# Patient Record
Sex: Female | Born: 1955 | Race: White | Hispanic: No | Marital: Married | Smoking: Former smoker
Health system: Southern US, Community
[De-identification: ages and names within clinical notes are randomized; demographics above are authoritative.]

## PROBLEM LIST (undated history)

## (undated) DIAGNOSIS — Z973 Presence of spectacles and contact lenses: Secondary | ICD-10-CM

## (undated) DIAGNOSIS — N200 Calculus of kidney: Secondary | ICD-10-CM

## (undated) DIAGNOSIS — K5730 Diverticulosis of large intestine without perforation or abscess without bleeding: Secondary | ICD-10-CM

## (undated) DIAGNOSIS — K219 Gastro-esophageal reflux disease without esophagitis: Secondary | ICD-10-CM

## (undated) DIAGNOSIS — N201 Calculus of ureter: Secondary | ICD-10-CM

## (undated) HISTORY — PX: UMBILICAL HERNIA REPAIR: SHX196

---

## 2010-04-26 ENCOUNTER — Encounter (INDEPENDENT_AMBULATORY_CARE_PROVIDER_SITE_OTHER): Payer: Self-pay | Admitting: Obstetrics and Gynecology

## 2010-04-26 ENCOUNTER — Ambulatory Visit (HOSPITAL_COMMUNITY): Admission: RE | Admit: 2010-04-26 | Discharge: 2010-04-27 | Payer: Self-pay | Admitting: Obstetrics and Gynecology

## 2010-04-26 HISTORY — PX: LAPAROSCOPIC ASSISTED VAGINAL HYSTERECTOMY: SHX5398

## 2010-08-29 LAB — COMPREHENSIVE METABOLIC PANEL WITH GFR
ALT: 12 U/L (ref 0–35)
AST: 22 U/L (ref 0–37)
Albumin: 2.9 g/dL — ABNORMAL LOW (ref 3.5–5.2)
Alkaline Phosphatase: 44 U/L (ref 39–117)
BUN: 3 mg/dL — ABNORMAL LOW (ref 6–23)
CO2: 29 meq/L (ref 19–32)
Calcium: 8.3 mg/dL — ABNORMAL LOW (ref 8.4–10.5)
Chloride: 102 meq/L (ref 96–112)
Creatinine, Ser: 0.63 mg/dL (ref 0.4–1.2)
GFR calc non Af Amer: >60 mL/min
Glucose, Bld: 119 mg/dL — ABNORMAL HIGH (ref 70–99)
Potassium: 4.5 meq/L (ref 3.5–5.1)
Sodium: 136 meq/L (ref 135–145)
Total Bilirubin: 0.8 mg/dL (ref 0.3–1.2)
Total Protein: 5.6 g/dL — ABNORMAL LOW (ref 6.0–8.3)

## 2010-08-29 LAB — CBC
HCT: 39.9 % (ref 36.0–46.0)
Hemoglobin: 10.5 g/dL — ABNORMAL LOW (ref 12.0–15.0)
Hemoglobin: 13.4 g/dL (ref 12.0–15.0)
MCH: 28.9 pg (ref 26.0–34.0)
MCH: 29.8 pg (ref 26.0–34.0)
MCHC: 33.5 g/dL (ref 30.0–36.0)
MCHC: 34.2 g/dL (ref 30.0–36.0)
MCV: 86.1 fL (ref 78.0–100.0)
Platelets: 255 10*3/uL (ref 150–400)
RBC: 4.64 MIL/uL (ref 3.87–5.11)
RDW: 16.4 % — ABNORMAL HIGH (ref 11.5–15.5)
RDW: 16.5 % — ABNORMAL HIGH (ref 11.5–15.5)
WBC: 6 10*3/uL (ref 4.0–10.5)

## 2010-08-29 LAB — SURGICAL PCR SCREEN
MRSA, PCR: NEGATIVE
Staphylococcus aureus: NEGATIVE

## 2011-04-04 ENCOUNTER — Other Ambulatory Visit: Payer: Self-pay | Admitting: Obstetrics and Gynecology

## 2012-04-23 ENCOUNTER — Other Ambulatory Visit: Payer: Self-pay | Admitting: Obstetrics and Gynecology

## 2012-04-23 DIAGNOSIS — N644 Mastodynia: Secondary | ICD-10-CM

## 2012-04-30 ENCOUNTER — Ambulatory Visit
Admission: RE | Admit: 2012-04-30 | Discharge: 2012-04-30 | Disposition: A | Discharge status: Home / Self Care | Payer: PRIVATE HEALTH INSURANCE | Source: Ambulatory Visit | Attending: Obstetrics and Gynecology | Admitting: Obstetrics and Gynecology

## 2012-04-30 DIAGNOSIS — N644 Mastodynia: Secondary | ICD-10-CM

## 2013-07-27 ENCOUNTER — Encounter (HOSPITAL_COMMUNITY): Payer: Self-pay | Admitting: Emergency Medicine

## 2013-07-27 ENCOUNTER — Emergency Department (HOSPITAL_COMMUNITY): Payer: PRIVATE HEALTH INSURANCE

## 2013-07-27 ENCOUNTER — Emergency Department (HOSPITAL_COMMUNITY)
Admission: EM | Admit: 2013-07-27 | Discharge: 2013-07-27 | Disposition: A | Discharge status: Home / Self Care | Payer: PRIVATE HEALTH INSURANCE | Attending: Emergency Medicine | Admitting: Emergency Medicine

## 2013-07-27 DIAGNOSIS — Z87891 Personal history of nicotine dependence: Secondary | ICD-10-CM | POA: Insufficient documentation

## 2013-07-27 DIAGNOSIS — N200 Calculus of kidney: Secondary | ICD-10-CM

## 2013-07-27 LAB — URINE MICROSCOPIC-ADD ON

## 2013-07-27 LAB — URINALYSIS, ROUTINE W REFLEX MICROSCOPIC
Bilirubin Urine: NEGATIVE
Glucose, UA: NEGATIVE mg/dL
KETONES UR: NEGATIVE mg/dL
Nitrite: NEGATIVE
PROTEIN: 30 mg/dL — AB
Specific Gravity, Urine: 1.022 (ref 1.005–1.030)
UROBILINOGEN UA: 0.2 mg/dL (ref 0.0–1.0)
pH: 5.5 (ref 5.0–8.0)

## 2013-07-27 LAB — CBC WITH DIFFERENTIAL/PLATELET
Basophils Absolute: 0 10*3/uL (ref 0.0–0.1)
Basophils Relative: 0 % (ref 0–1)
Eosinophils Absolute: 0.1 10*3/uL (ref 0.0–0.7)
Eosinophils Relative: 1 % (ref 0–5)
HEMATOCRIT: 41.9 % (ref 36.0–46.0)
HEMOGLOBIN: 15 g/dL (ref 12.0–15.0)
LYMPHS PCT: 18 % (ref 12–46)
Lymphs Abs: 1.9 10*3/uL (ref 0.7–4.0)
MCH: 33 pg (ref 26.0–34.0)
MCHC: 35.8 g/dL (ref 30.0–36.0)
MCV: 92.3 fL (ref 78.0–100.0)
MONO ABS: 0.2 10*3/uL (ref 0.1–1.0)
MONOS PCT: 2 % — AB (ref 3–12)
NEUTROS ABS: 8.2 10*3/uL — AB (ref 1.7–7.7)
NEUTROS PCT: 79 % — AB (ref 43–77)
Platelets: 248 10*3/uL (ref 150–400)
RBC: 4.54 MIL/uL (ref 3.87–5.11)
RDW: 12.5 % (ref 11.5–15.5)
WBC: 10.3 10*3/uL (ref 4.0–10.5)

## 2013-07-27 LAB — COMPREHENSIVE METABOLIC PANEL
ALT: 20 U/L (ref 0–35)
AST: 19 U/L (ref 0–37)
Albumin: 4 g/dL (ref 3.5–5.2)
Alkaline Phosphatase: 59 U/L (ref 39–117)
BUN: 13 mg/dL (ref 6–23)
CALCIUM: 9.5 mg/dL (ref 8.4–10.5)
CO2: 26 mEq/L (ref 19–32)
CREATININE: 0.69 mg/dL (ref 0.50–1.10)
Chloride: 99 mEq/L (ref 96–112)
GFR calc non Af Amer: >90 mL/min (ref >90)
GLUCOSE: 134 mg/dL — AB (ref 70–99)
Potassium: 4.3 mEq/L (ref 3.7–5.3)
SODIUM: 139 meq/L (ref 137–147)
TOTAL PROTEIN: 7.7 g/dL (ref 6.0–8.3)
Total Bilirubin: 0.5 mg/dL (ref 0.3–1.2)

## 2013-07-27 MED ORDER — ONDANSETRON 4 MG PO TBDP: 4.0000 mg | ORAL_TABLET | Freq: Three times a day (TID) | ORAL | Status: DC | PRN

## 2013-07-27 MED ORDER — HYDROMORPHONE HCL PF 1 MG/ML IJ SOLN
0.5000 mg | Freq: Once | INTRAMUSCULAR | Status: AC
  Administered 2013-07-27: 0.5 mg via INTRAVENOUS
  Filled 2013-07-27: qty 1

## 2013-07-27 MED ORDER — SODIUM CHLORIDE 0.9 % IV BOLUS (SEPSIS)
1000.0000 mL | Freq: Once | INTRAVENOUS | Status: AC
  Administered 2013-07-27: 1000 mL via INTRAVENOUS

## 2013-07-27 MED ORDER — ONDANSETRON HCL 4 MG/2ML IJ SOLN
4.0000 mg | Freq: Once | INTRAMUSCULAR | Status: AC
  Administered 2013-07-27: 4 mg via INTRAVENOUS
  Filled 2013-07-27: qty 2

## 2013-07-27 MED ORDER — HYDROCODONE-ACETAMINOPHEN 5-325 MG PO TABS: 1.0000 | ORAL_TABLET | Freq: Four times a day (QID) | ORAL | Status: DC | PRN

## 2013-07-27 NOTE — ED Provider Notes (Signed)
CSN: [id number]     Arrival date & time 07/27/13  [id number] History   First MD Initiated Contact with Patient 07/27/13 [phone]     Chief Complaint  Patient presents with  . Pelvic Pain    The history is provided by the patient and the spouse.    Patient presents with right lower quadrant pain.  Pain woke the patient up from sleep approximately 3 hours prior to my evaluation.  Since onset has been constant, though with waxing/waning severity.  Pain is sharp, burning, focally about the right lower quadrant with minimal radiation. Associated nausea, anorexia, no vomiting, no diarrhea, no urinary changes. No fever, no chills, though there is generalized discomfort. No clear alleviating or exacerbating factors. Patient has history of hysterectomy, cystopexy, abdominal hernia repair.   History reviewed. No pertinent past medical history. History reviewed. No pertinent past surgical history. History reviewed. No pertinent family history. History  Substance Use Topics  . Smoking status: Former Games developermoker  . Smokeless tobacco: Not on file  . Alcohol Use: Yes   OB History   Grav Para Term Preterm Abortions TAB SAB Ect Mult Living                 Review of Systems  Constitutional:       Per HPI, otherwise negative  HENT:       Per HPI, otherwise negative  Respiratory:       Per HPI, otherwise negative  Cardiovascular:       Per HPI, otherwise negative  Gastrointestinal: Positive for nausea and abdominal pain. Negative for vomiting.  Endocrine:       Negative aside from HPI  Genitourinary:       Neg aside from HPI   Musculoskeletal:       Per HPI, otherwise negative  Skin: Negative.   Neurological: Negative for syncope.    Allergies  Review of patient's allergies indicates not on file.  Home Medications  No current outpatient prescriptions on file. BP 146/54  Pulse 58  Temp(Src) 97.6 F (36.4 C) (Oral)  Resp 20  SpO2 100% Physical Exam  Nursing note and vitals  reviewed. Constitutional: She is oriented to person, place, and time. She appears well-developed and well-nourished. No distress.  HENT:  Head: Normocephalic and atraumatic.  Eyes: Conjunctivae and EOM are normal.  Cardiovascular: Normal rate and regular rhythm.   Pulmonary/Chest: Effort normal and breath sounds normal. No stridor. No respiratory distress.  Abdominal: She exhibits no distension. There is tenderness in the right lower quadrant. There is tenderness at McBurney's point. There is no rigidity, no rebound, no guarding and negative Murphy's sign.    Musculoskeletal: She exhibits no edema.  Neurological: She is alert and oriented to person, place, and time. No cranial nerve deficit.  Skin: Skin is warm and dry.  Psychiatric: She has a normal mood and affect.    ED Course  Procedures (including critical care time) Labs Review Labs Reviewed  URINALYSIS, ROUTINE W REFLEX MICROSCOPIC  COMPREHENSIVE METABOLIC PANEL  CBC WITH DIFFERENTIAL   Imaging Review No results found.  EKG Interpretation   None      8:58 AM Patient with substantially better pain level.   MDM  This patient presents with new right lower quadrant  pain.  On exam she is awake, alert, hemodynamically stable, afebrile. Patient's pain resolved here.  Evaluation demonstrates presence of a nonobstructive  kidney stone, with no evidence of infection. Patient was discharged with analgesics, antiemetics, urology followup.  Gerhard Munch, MD 07/27/13 [phone]

## 2013-07-27 NOTE — ED Notes (Signed)
Pelvic cart at bedside, pt placed in gown. MD at bedside

## 2013-07-27 NOTE — ED Notes (Signed)
Pt arrived to the Ed with a complaint of right lower pelvic pain.  Pt states the pain woke her out of her sleep at 0400.  Pt states she is able to urinate without difficulty.  Pt states the pain is sharp in nature and intermittently grows intolerable,

## 2013-07-27 NOTE — Discharge Instructions (Signed)
Kidney Stones  Kidney stones (urolithiasis) are deposits that form inside your kidneys. The intense pain is caused by the stone moving through the urinary tract. When the stone moves, the ureter goes into spasm around the stone. The stone is usually passed in the urine.   CAUSES   · A disorder that makes certain neck glands produce too much parathyroid hormone (primary hyperparathyroidism).  · A buildup of uric acid crystals, similar to gout in your joints.  · Narrowing (stricture) of the ureter.  · A kidney obstruction present at birth (congenital obstruction).  · Previous surgery on the kidney or ureters.  · Numerous kidney infections.  SYMPTOMS   · Feeling sick to your stomach (nauseous).  · Throwing up (vomiting).  · Blood in the urine (hematuria).  · Pain that usually spreads (radiates) to the groin.  · Frequency or urgency of urination.  DIAGNOSIS   · Taking a history and physical exam.  · Blood or urine tests.  · CT scan.  · Occasionally, an examination of the inside of the urinary bladder (cystoscopy) is performed.  TREATMENT   · Observation.  · Increasing your fluid intake.  · Extracorporeal shock wave lithotripsy This is a noninvasive procedure that uses shock waves to break up kidney stones.  · Surgery may be needed if you have severe pain or persistent obstruction. There are various surgical procedures. Most of the procedures are performed with the use of small instruments. Only small incisions are needed to accommodate these instruments, so recovery time is minimized.  The size, location, and chemical composition are all important variables that will determine the proper choice of action for you. Talk to your health care provider to better understand your situation so that you will minimize the risk of injury to yourself and your kidney.   HOME CARE INSTRUCTIONS   · Drink enough water and fluids to keep your urine clear or pale yellow. This will help you to pass the stone or stone fragments.  · Strain  all urine through the provided strainer. Keep all particulate matter and stones for your health care provider to see. The stone causing the pain may be as small as a grain of salt. It is very important to use the strainer each and every time you pass your urine. The collection of your stone will allow your health care provider to analyze it and verify that a stone has actually passed. The stone analysis will often identify what you can do to reduce the incidence of recurrences.  · Only take over-the-counter or prescription medicines for pain, discomfort, or fever as directed by your health care provider.  · Make a follow-up appointment with your health care provider as directed.  · Get follow-up X-rays if required. The absence of pain does not always mean that the stone has passed. It may have only stopped moving. If the urine remains completely obstructed, it can cause loss of kidney function or even complete destruction of the kidney. It is your responsibility to make sure X-rays and follow-ups are completed. Ultrasounds of the kidney can show blockages and the status of the kidney. Ultrasounds are not associated with any radiation and can be performed easily in a matter of minutes.  SEEK MEDICAL CARE IF:  · You experience pain that is progressive and unresponsive to any pain medicine you have been prescribed.  SEEK IMMEDIATE MEDICAL CARE IF:   · Pain cannot be controlled with the prescribed medicine.  · You have a fever   or shaking chills.  · The severity or intensity of pain increases over 18 hours and is not relieved by pain medicine.  · You develop a new onset of abdominal pain.  · You feel faint or pass out.  · You are unable to urinate.  MAKE SURE YOU:   · Understand these instructions.  · Will watch your condition.  · Will get help right away if you are not doing well or get worse.  Document Released: 06/04/2005 Document Revised: 02/04/2013 Document Reviewed: 11/05/2012  [employer]® Patient Information ©2014  [employer].

## 2013-10-01 ENCOUNTER — Other Ambulatory Visit: Payer: Self-pay | Admitting: Obstetrics and Gynecology

## 2013-10-21 ENCOUNTER — Emergency Department (HOSPITAL_COMMUNITY)
Admission: EM | Admit: 2013-10-21 | Discharge: 2013-10-22 | Disposition: A | Discharge status: Home / Self Care | Payer: PRIVATE HEALTH INSURANCE | Attending: Emergency Medicine | Admitting: Emergency Medicine

## 2013-10-21 ENCOUNTER — Encounter (HOSPITAL_COMMUNITY): Payer: Self-pay | Admitting: Emergency Medicine

## 2013-10-21 ENCOUNTER — Emergency Department (HOSPITAL_COMMUNITY): Payer: PRIVATE HEALTH INSURANCE

## 2013-10-21 DIAGNOSIS — Z87891 Personal history of nicotine dependence: Secondary | ICD-10-CM | POA: Insufficient documentation

## 2013-10-21 DIAGNOSIS — R112 Nausea with vomiting, unspecified: Secondary | ICD-10-CM | POA: Diagnosis not present

## 2013-10-21 DIAGNOSIS — R109 Unspecified abdominal pain: Secondary | ICD-10-CM | POA: Diagnosis present

## 2013-10-21 DIAGNOSIS — N201 Calculus of ureter: Secondary | ICD-10-CM | POA: Insufficient documentation

## 2013-10-21 LAB — URINALYSIS, DIPSTICK ONLY
GLUCOSE, UA: NEGATIVE mg/dL
Ketones, ur: 15 mg/dL — AB
NITRITE: NEGATIVE
PROTEIN: 100 mg/dL — AB
Specific Gravity, Urine: 1.025 (ref 1.005–1.030)
Urobilinogen, UA: 0.2 mg/dL (ref 0.0–1.0)
pH: 5.5 (ref 5.0–8.0)

## 2013-10-21 LAB — CBC
HCT: 42 % (ref 36.0–46.0)
Hemoglobin: 14.8 g/dL (ref 12.0–15.0)
MCH: 32.7 pg (ref 26.0–34.0)
MCHC: 35.2 g/dL (ref 30.0–36.0)
MCV: 92.7 fL (ref 78.0–100.0)
PLATELETS: 246 10*3/uL (ref 150–400)
RBC: 4.53 MIL/uL (ref 3.87–5.11)
RDW: 12.5 % (ref 11.5–15.5)
WBC: 12.8 10*3/uL — AB (ref 4.0–10.5)

## 2013-10-21 LAB — COMPREHENSIVE METABOLIC PANEL
ALBUMIN: 4 g/dL (ref 3.5–5.2)
ALT: 20 U/L (ref 0–35)
AST: 28 U/L (ref 0–37)
Alkaline Phosphatase: 59 U/L (ref 39–117)
BUN: 12 mg/dL (ref 6–23)
CALCIUM: 9.2 mg/dL (ref 8.4–10.5)
CHLORIDE: 102 meq/L (ref 96–112)
CO2: 25 meq/L (ref 19–32)
CREATININE: 0.71 mg/dL (ref 0.50–1.10)
GFR calc Af Amer: >90 mL/min (ref >90)
Glucose, Bld: 106 mg/dL — ABNORMAL HIGH (ref 70–99)
Potassium: 4.2 mEq/L (ref 3.7–5.3)
SODIUM: 141 meq/L (ref 137–147)
Total Bilirubin: 0.6 mg/dL (ref 0.3–1.2)
Total Protein: 7.6 g/dL (ref 6.0–8.3)

## 2013-10-21 MED ORDER — ONDANSETRON HCL 4 MG/2ML IJ SOLN
4.0000 mg | Freq: Once | INTRAMUSCULAR | Status: AC
  Administered 2013-10-21: 4 mg via INTRAVENOUS
  Filled 2013-10-21: qty 2

## 2013-10-21 MED ORDER — FENTANYL CITRATE 0.05 MG/ML IJ SOLN
50.0000 ug | Freq: Once | INTRAMUSCULAR | Status: AC
  Administered 2013-10-21: 50 ug via INTRAVENOUS
  Filled 2013-10-21: qty 2

## 2013-10-21 NOTE — ED Provider Notes (Signed)
CSN: [id number]     Arrival date & time 10/21/13  2119 History   First MD Initiated Contact with Patient 10/21/13 2220     Chief Complaint  Patient presents with  . Flank Pain     (Consider location/radiation/quality/duration/timing/severity/associated sxs/prior Treatment) HPI Comments: Patient is a 58 year old female past medical history significant for renal disorder presented to the emergency department for right flank pain with radiation into the abdomen. Patient states her symptoms initially started last Friday and were very mild. She states her symptoms seem to have resolved until yesterday when they returned more severely. She states that she is having sharp colicky pain with radiation into right lower quadrant with associated nausea and emesis. Patient states that she has also been getting over a stomach bug, she has had multiple days prior with nausea, nonbloody nonbilious emesis was nonbloody diarrhea, she states her husband was also sick with the same sometimes. Patient states that this pain feels similar to her in February when she had a kidney stone. Patient has a scheduled followup with [facility] tomorrow. Denies any fevers.   Past Medical History  Diagnosis Date  . Renal disorder    Past Surgical History  Procedure Laterality Date  . Abdominal hysterectomy    . Myomectomy     No family history on file. History  Substance Use Topics  . Smoking status: Former Games developer  . Smokeless tobacco: Not on file  . Alcohol Use: Yes     Comment: rare   OB History   Grav Para Term Preterm Abortions TAB SAB Ect Mult Living                 Review of Systems  Constitutional: Negative for fever and chills.  Gastrointestinal: Positive for nausea, vomiting and abdominal pain.  Genitourinary: Positive for urgency, flank pain and decreased urine volume.  All other systems reviewed and are negative.     Allergies  Iodine and Avocado  Home Medications   Prior to Admission  medications   Medication Sig Start Date End Date Taking? Authorizing Provider  calcium carbonate (TUMS EX) 750 MG chewable tablet Chew 1 tablet by mouth 2 (two) times daily as needed for heartburn. Acid reflex   Yes Historical Provider, MD  diphenhydramine-acetaminophen (TYLENOL PM) 25-500 MG TABS Take 1 tablet by mouth at bedtime as needed (sleep).   Yes Historical Provider, MD  estrogens, conjugated, (PREMARIN) 0.625 MG tablet Take 0.625 mg by mouth at bedtime.   Yes Historical Provider, MD  fluticasone (FLONASE) 50 MCG/ACT nasal spray Place 1 spray into both nostrils daily as needed for allergies or rhinitis.   Yes Historical Provider, MD  HYDROcodone-acetaminophen (NORCO/VICODIN) 5-325 MG per tablet Take 1 tablet by mouth every 6 (six) hours as needed for moderate pain.   Yes Historical Provider, MD  ibuprofen (ADVIL,MOTRIN) 200 MG tablet Take 400 mg by mouth every 6 (six) hours as needed for moderate pain.   Yes Historical Provider, MD   BP 135/67  Pulse 68  Temp(Src) 98.4 F (36.9 C) (Oral)  Resp 18  Ht 5\' 3"  (1.6 m)  Wt 180 lb (81.647 kg)  BMI 31.89 kg/m2  SpO2 99% Physical Exam  Nursing note and vitals reviewed. Constitutional: She is oriented to person, place, and time. She appears well-developed and well-nourished. No distress.  HENT:  Head: Normocephalic and atraumatic.  Right Ear: External ear normal.  Left Ear: External ear normal.  Nose: Nose normal.  Mouth/Throat: Uvula is midline, oropharynx is clear and  moist and mucous membranes are normal.  Eyes: Conjunctivae are normal.  Neck: Normal range of motion. Neck supple.  Cardiovascular: Normal rate, regular rhythm and normal heart sounds.   Pulmonary/Chest: Effort normal and breath sounds normal. No respiratory distress.  Abdominal: Soft. Bowel sounds are normal. She exhibits no distension. There is tenderness. There is no rebound.  Musculoskeletal: Normal range of motion.  Neurological: She is alert and oriented to  person, place, and time.  Skin: Skin is warm and dry. She is not diaphoretic.  Psychiatric: She has a normal mood and affect.    ED Course  Procedures (including critical care time) Medications  ondansetron (ZOFRAN) injection 4 mg (4 mg Intravenous Given 10/21/13 2227)  fentaNYL (SUBLIMAZE) injection 50 mcg (50 mcg Intravenous Given 10/21/13 2227)  ondansetron (ZOFRAN-ODT) disintegrating tablet 8 mg (8 mg Oral Given 10/22/13 0107)  oxyCODONE-acetaminophen (PERCOCET/ROXICET) 5-325 MG per tablet 1 tablet (1 tablet Oral Given 10/22/13 0107)    Labs Review Labs Reviewed  CBC - Abnormal; Notable for the following:    WBC 12.8 (*)    All other components within normal limits  COMPREHENSIVE METABOLIC PANEL - Abnormal; Notable for the following:    Glucose, Bld 106 (*)    All other components within normal limits  URINALYSIS, DIPSTICK ONLY - Abnormal; Notable for the following:    Hgb urine dipstick LARGE (*)    Bilirubin Urine SMALL (*)    Ketones, ur 15 (*)    Protein, ur 100 (*)    Leukocytes, UA TRACE (*)    All other components within normal limits  URINE CULTURE    Imaging Review Ct Abdomen Pelvis Wo Contrast  10/22/2013   ADDENDUM REPORT: 10/22/2013 00:40  ADDENDUM: There is an error within the impression portion of the report. The first line of the impression should read 4 mm obstructive stone within the distal right ureter with secondary moderate right hydroureteronephrosis. The word "or" in the initial report is a dictation error.   Electronically Signed   By: Rise MuBenjamin  McClintock M.D.   On: 10/22/2013 00:40   10/22/2013   CLINICAL DATA:  Right flank pain.  EXAM: CT ABDOMEN AND PELVIS WITHOUT CONTRAST  TECHNIQUE: Multidetector CT imaging of the abdomen and pelvis was performed following the standard protocol without IV contrast.  COMPARISON:  Prior CT from 07/27/2013  FINDINGS: The visualized lung bases are clear.  Diffuse hypoattenuation of the liver is compatible with steatosis.  Gallbladder is within normal limits. No biliary ductal dilatation. The spleen, adrenal glands, and pancreas demonstrate a normal unenhanced appearance. There is an AA and 4 mm obstructive stone within the distal right ureter (series 2, image 72), approximately 1 cm proximal to the right UVJ. There is secondary moderate right hydroureteronephrosis. There is associated perinephric and periureteral fat stranding.  3 mm nonobstructive stone present within the lower pole of the left kidney. A few additional punctate calcific density seen within the interpolar left kidney may represent additional small stones. No stones seen along the course of the left renal collecting system. There is no left-sided hydronephrosis or hydroureter.  No evidence of bowel obstruction. No inflammatory changes seen about the bowels. Appendix is normal. Sigmoid diverticulosis without acute diverticulitis.  Bladder is within normal limits.  Uterus and ovaries are not visualized.  No free air or fluid. No enlarged intra-abdominal pelvic lymph nodes.  No acute osseous abnormality. No worrisome lytic or blastic osseous lesions.  IMPRESSION: 1. 4 mm or obstructive stone within the distal right  ureter with secondary moderate right hydroureteronephrosis. 2. 3 mm nonobstructive left renal calculus. 3. Sigmoid diverticulosis without acute diverticulitis. 4. Hepatic steatosis.  Electronically Signed: By: Rise MuBenjamin  McClintock M.D. On: 10/22/2013 00:19     EKG Interpretation None      MDM   Final diagnoses:  Right ureteral stone    Filed Vitals:   10/22/13 0111  BP: 135/67  Pulse: 68  Temp: 98.4 F (36.9 C)  Resp: 18   Afebrile, NAD, non-toxic appearing, AAOx4. Abdomen is soft, mildly tender in right lower quadrant, normal bowel sounds. No peritoneal signs. No CVA tenderness. Labs reviewed.  Pt has been diagnosed with a Kidney Stone via CT. There is no evidence of significant hydronephrosis, serum creatine WNL, vitals sign stable  and the pt does not have irratractable vomiting. Patient able to tolerate by mouth liquids in the emergency department without difficulty. Pt will be dc home with pain medications & has been advised to keep followup appointment with urologist tomorrow. Return precautions discussed with patient who is agreeable to plan and will return if anything changes prior to appointment. Patient stable at time of discharge.   Lise AuerJennifer L Flynn Lininger, PA-C 10/22/13 0121

## 2013-10-21 NOTE — ED Notes (Signed)
Pt c/o R flank pain onset yesterday with RLQ pain with n/v onset yesterday. Previous kidney stones, unable to keep pain meds down d/t vomiting.

## 2013-10-22 LAB — URINE CULTURE
COLONY COUNT: NO GROWTH
CULTURE: NO GROWTH

## 2013-10-22 MED ORDER — ONDANSETRON 8 MG PO TBDP
8.0000 mg | ORAL_TABLET | Freq: Once | ORAL | Status: AC
  Administered 2013-10-22: 8 mg via ORAL
  Filled 2013-10-22: qty 1

## 2013-10-22 MED ORDER — TAMSULOSIN HCL 0.4 MG PO CAPS: 0.4000 mg | ORAL_CAPSULE | Freq: Every day | ORAL | Status: DC

## 2013-10-22 MED ORDER — OXYCODONE-ACETAMINOPHEN 5-325 MG PO TABS
1.0000 | ORAL_TABLET | Freq: Four times a day (QID) | ORAL | Status: DC | PRN
Start: 2013-10-22 — End: 2013-11-11

## 2013-10-22 MED ORDER — ONDANSETRON 4 MG PO TBDP: 4.0000 mg | ORAL_TABLET | Freq: Three times a day (TID) | ORAL | Status: DC | PRN

## 2013-10-22 MED ORDER — OXYCODONE-ACETAMINOPHEN 5-325 MG PO TABS
1.0000 | ORAL_TABLET | Freq: Once | ORAL | Status: AC
  Administered 2013-10-22: 1 via ORAL
  Filled 2013-10-22: qty 1

## 2013-10-22 NOTE — Discharge Instructions (Signed)
Please follow up with your primary care physician in 1-2 days. If you do not have one please call the [facility] number listed above. Please follow up with your scheduled urology appointment tomorrow. Please take pain medication and/or muscle relaxants as prescribed and as needed for pain. Please do not drive on narcotic pain medication or on muscle relaxants. Please read all discharge instructions and return precautions.    Kidney Stones Kidney stones (urolithiasis) are deposits that form inside your kidneys. The intense pain is caused by the stone moving through the urinary tract. When the stone moves, the ureter goes into spasm around the stone. The stone is usually passed in the urine.  CAUSES   A disorder that makes certain neck glands produce too much parathyroid hormone (primary hyperparathyroidism).  A buildup of uric acid crystals, similar to gout in your joints.  Narrowing (stricture) of the ureter.  A kidney obstruction present at birth (congenital obstruction).  Previous surgery on the kidney or ureters.  Numerous kidney infections. SYMPTOMS   Feeling sick to your stomach (nauseous).  Throwing up (vomiting).  Blood in the urine (hematuria).  Pain that usually spreads (radiates) to the groin.  Frequency or urgency of urination. DIAGNOSIS   Taking a history and physical exam.  Blood or urine tests.  CT scan.  Occasionally, an examination of the inside of the urinary bladder (cystoscopy) is performed. TREATMENT   Observation.  Increasing your fluid intake.  Extracorporeal shock wave lithotripsy This is a noninvasive procedure that uses shock waves to break up kidney stones.  Surgery may be needed if you have severe pain or persistent obstruction. There are various surgical procedures. Most of the procedures are performed with the use of small instruments. Only small incisions are needed to accommodate these instruments, so recovery time is  minimized. The size, location, and chemical composition are all important variables that will determine the proper choice of action for you. Talk to your health care provider to better understand your situation so that you will minimize the risk of injury to yourself and your kidney.  HOME CARE INSTRUCTIONS   Drink enough water and fluids to keep your urine clear or pale yellow. This will help you to pass the stone or stone fragments.  Strain all urine through the provided strainer. Keep all particulate matter and stones for your health care provider to see. The stone causing the pain may be as small as a grain of salt. It is very important to use the strainer each and every time you pass your urine. The collection of your stone will allow your health care provider to analyze it and verify that a stone has actually passed. The stone analysis will often identify what you can do to reduce the incidence of recurrences.  Only take over-the-counter or prescription medicines for pain, discomfort, or fever as directed by your health care provider.  Make a follow-up appointment with your health care provider as directed.  Get follow-up X-rays if required. The absence of pain does not always mean that the stone has passed. It may have only stopped moving. If the urine remains completely obstructed, it can cause loss of kidney function or even complete destruction of the kidney. It is your responsibility to make sure X-rays and follow-ups are completed. Ultrasounds of the kidney can show blockages and the status of the kidney. Ultrasounds are not associated with any radiation and can be performed easily in a matter of minutes. SEEK MEDICAL CARE  IF:  You experience pain that is progressive and unresponsive to any pain medicine you have been prescribed. SEEK IMMEDIATE MEDICAL CARE IF:   Pain cannot be controlled with the prescribed medicine.  You have a fever or shaking chills.  The severity or intensity  of pain increases over 18 hours and is not relieved by pain medicine.  You develop a new onset of abdominal pain.  You feel faint or pass out.  You are unable to urinate. MAKE SURE YOU:   Understand these instructions.  Will watch your condition.  Will get help right away if you are not doing well or get worse. Document Released: 06/04/2005 Document Revised: 02/04/2013 Document Reviewed: 11/05/2012 [facility] Patient Information 2014 [place], [place].

## 2013-10-23 ENCOUNTER — Other Ambulatory Visit: Payer: Self-pay | Admitting: Urology

## 2013-10-23 NOTE — ED Provider Notes (Signed)
Medical screening examination/treatment/procedure(s) were performed by non-physician practitioner and as supervising physician I was immediately available for consultation/collaboration.   EKG Interpretation None        Lyanne CoKevin M Adalina Dopson, MD 10/23/13 0100

## 2013-11-04 ENCOUNTER — Encounter (HOSPITAL_BASED_OUTPATIENT_CLINIC_OR_DEPARTMENT_OTHER): Payer: Self-pay | Admitting: *Deleted

## 2013-11-05 ENCOUNTER — Encounter (HOSPITAL_BASED_OUTPATIENT_CLINIC_OR_DEPARTMENT_OTHER): Payer: Self-pay | Admitting: *Deleted

## 2013-11-05 NOTE — Progress Notes (Signed)
NPO AFTER MN. ARRIVE AT 1000. CURRENT LAB RESULTS IN CHART AND EPIC. MAY TAKE PAIN/ NAUSEA RX'S IF NEEDED AM DOS W/ SIPS OF WATER.

## 2013-11-10 NOTE — H&P (Signed)
Urology History and Physical Exam  CC: Right ureter stone.  HPI:  58 year old female presents today for a right ureter stone.  This was discovered on CT scan in the ER 10/21/13.  The stones 4 mm in size.  It was located in the right distal ureter.  She was seen previously in favor 2015 with a similar size and location stone which she does not think has passed.  This is associated with flank pain.  It is also associated with hydronephrosis.  She has a small left-sided 3 mm stone which is nonobstructing.  Urine culture in the ER from 5/6S 15 was negative for growth.  We have discussed management options along with risks, benefits, side effects, and likelihood of achieving goals.  Plan to proceed today with cystoscopy, right ureteroscopy, laser lithotripsy, right retrograde program, possible right ureter stent placement.  PMH: Past Medical History  Diagnosis Date  . Right ureteral stone   . Renal calculus, left   . Sigmoid diverticulosis   . GERD (gastroesophageal reflux disease)   . Wears contact lenses     PSH: Past Surgical History  Procedure Laterality Date  . Laparoscopic assisted vaginal hysterectomy  04-26-2010    W/  BILATERAL SALPINGOOPHORECTOMY/  TVT SLING/  ANTERIOR REPAIR  . Umbilical hernia repair  6 MONTHS OLD    Allergies: Allergies  Allergen Reactions  . Iodine Itching and Swelling    Eyes swell/  Mouth itches    . Shellfish Allergy Swelling  . Avocado Nausea And Vomiting, Swelling and Rash    Medications: No prescriptions prior to admission     Social History: History   Social History  . Marital Status: Married    Spouse Name: N/A    Number of Children: N/A  . Years of Education: N/A   Occupational History  . Not on file.   Social History Main Topics  . Smoking status: Former Smoker -- 0.75 packs/day for 6 years    Types: Cigarettes    Quit date: 11/06/1983  . Smokeless tobacco: Never Used  . Alcohol Use: Yes     Comment: rare  . Drug Use: No   . Sexual Activity: Yes   Other Topics Concern  . Not on file   Social History Narrative  . No narrative on file    Family History: History reviewed. No pertinent family history.  Review of Systems: Positive: Right flank pain. Negative: Nausea, SOB, or fever.  A further 10 point review of systems was negative except what is listed in the HPI.  Physical Exam: Filed Vitals:   11/11/13 1014  BP: 137/61  Pulse: 81  Temp: 97.9 F (36.6 C)  Resp: 14    General: No acute distress.  Awake. Head:  Normocephalic.  Atraumatic. ENT:  EOMI.  Mucous membranes moist Neck:  Supple.  No lymphadenopathy. CV:  S1 present. S2 present. Regular rate. Pulmonary: Equal effort bilaterally.  Clear to auscultation bilaterally. Abdomen: Soft.  Non- tender to palpation. Skin:  Normal turgor.  No visible rash. Extremity: No gross deformity of bilateral upper extremities.  No gross deformity of    bilateral lower extremities. Neurologic: Alert. Appropriate mood.   Studies:  No results found for this basename: HGB, WBC, PLT,  in the last 72 hours  No results found for this basename: NA, K, CL, CO2, BUN, CREATININE, CALCIUM, MAGNESIUM, GFRNONAA, GFRAA,  in the last 72 hours   No results found for this basename: PT, INR, APTT,  in the last 72 hours  No components found with this basename: ABG,     Assessment:  Right ureter stone.  Plan: Plan to proceed today with cystoscopy, right ureteroscopy, laser lithotripsy, right retrograde program, possible right ureter stent placement.

## 2013-11-11 ENCOUNTER — Encounter (HOSPITAL_BASED_OUTPATIENT_CLINIC_OR_DEPARTMENT_OTHER): Payer: PRIVATE HEALTH INSURANCE | Admitting: Certified Registered"

## 2013-11-11 ENCOUNTER — Ambulatory Visit (HOSPITAL_BASED_OUTPATIENT_CLINIC_OR_DEPARTMENT_OTHER)
Admission: RE | Admit: 2013-11-11 | Discharge: 2013-11-11 | Disposition: A | Discharge status: Home / Self Care | Payer: PRIVATE HEALTH INSURANCE | Source: Ambulatory Visit | Attending: Urology | Admitting: Urology

## 2013-11-11 ENCOUNTER — Ambulatory Visit (HOSPITAL_BASED_OUTPATIENT_CLINIC_OR_DEPARTMENT_OTHER): Payer: PRIVATE HEALTH INSURANCE | Admitting: Certified Registered"

## 2013-11-11 ENCOUNTER — Encounter (HOSPITAL_BASED_OUTPATIENT_CLINIC_OR_DEPARTMENT_OTHER): Payer: Self-pay | Admitting: *Deleted

## 2013-11-11 ENCOUNTER — Encounter (HOSPITAL_BASED_OUTPATIENT_CLINIC_OR_DEPARTMENT_OTHER)
Admission: RE | Disposition: A | Discharge status: Home / Self Care | Payer: Self-pay | Source: Ambulatory Visit | Attending: Urology

## 2013-11-11 DIAGNOSIS — N135 Crossing vessel and stricture of ureter without hydronephrosis: Secondary | ICD-10-CM

## 2013-11-11 DIAGNOSIS — K219 Gastro-esophageal reflux disease without esophagitis: Secondary | ICD-10-CM | POA: Insufficient documentation

## 2013-11-11 DIAGNOSIS — Z87891 Personal history of nicotine dependence: Secondary | ICD-10-CM | POA: Insufficient documentation

## 2013-11-11 DIAGNOSIS — E669 Obesity, unspecified: Secondary | ICD-10-CM | POA: Insufficient documentation

## 2013-11-11 DIAGNOSIS — N201 Calculus of ureter: Secondary | ICD-10-CM | POA: Insufficient documentation

## 2013-11-11 HISTORY — DX: Calculus of kidney: N20.0

## 2013-11-11 HISTORY — DX: Presence of spectacles and contact lenses: Z97.3

## 2013-11-11 HISTORY — DX: Diverticulosis of large intestine without perforation or abscess without bleeding: K57.30

## 2013-11-11 HISTORY — PX: CYSTOSCOPY WITH RETROGRADE PYELOGRAM, URETEROSCOPY AND STENT PLACEMENT: SHX5789

## 2013-11-11 HISTORY — DX: Gastro-esophageal reflux disease without esophagitis: K21.9

## 2013-11-11 HISTORY — DX: Calculus of ureter: N20.1

## 2013-11-11 SURGERY — CYSTOURETEROSCOPY, WITH RETROGRADE PYELOGRAM AND STENT INSERTION
Anesthesia: General | Site: Ureter | Laterality: Right

## 2013-11-11 MED ORDER — PHENAZOPYRIDINE HCL 200 MG PO TABS: 200.0000 mg | ORAL_TABLET | Freq: Three times a day (TID) | ORAL | Status: DC | PRN

## 2013-11-11 MED ORDER — KETOROLAC TROMETHAMINE 30 MG/ML IJ SOLN
INTRAMUSCULAR | Status: DC | PRN
  Administered 2013-11-11: 30 mg via INTRAVENOUS

## 2013-11-11 MED ORDER — IOHEXOL 350 MG/ML SOLN
INTRAVENOUS | Status: DC | PRN
  Administered 2013-11-11: 10 mL

## 2013-11-11 MED ORDER — PROPOFOL 10 MG/ML IV BOLUS
INTRAVENOUS | Status: DC | PRN
  Administered 2013-11-11: 160 mg via INTRAVENOUS

## 2013-11-11 MED ORDER — SENNOSIDES-DOCUSATE SODIUM 8.6-50 MG PO TABS: 1.0000 | ORAL_TABLET | Freq: Two times a day (BID) | ORAL | Status: DC

## 2013-11-11 MED ORDER — BELLADONNA ALKALOIDS-OPIUM 16.2-60 MG RE SUPP
RECTAL | Status: AC
  Filled 2013-11-11: qty 1

## 2013-11-11 MED ORDER — OXYBUTYNIN CHLORIDE 5 MG PO TABS: 5.0000 mg | ORAL_TABLET | Freq: Four times a day (QID) | ORAL | Status: DC | PRN

## 2013-11-11 MED ORDER — CEPHALEXIN 500 MG PO CAPS: 500.0000 mg | ORAL_CAPSULE | Freq: Three times a day (TID) | ORAL | Status: DC

## 2013-11-11 MED ORDER — SODIUM CHLORIDE 0.9 % IR SOLN
Status: DC | PRN
  Administered 2013-11-11: 4000 mL

## 2013-11-11 MED ORDER — FENTANYL CITRATE 0.05 MG/ML IJ SOLN
25.0000 ug | INTRAMUSCULAR | Status: DC | PRN
  Filled 2013-11-11: qty 1

## 2013-11-11 MED ORDER — MIDAZOLAM HCL 5 MG/5ML IJ SOLN
INTRAMUSCULAR | Status: DC | PRN
  Administered 2013-11-11: 2 mg via INTRAVENOUS

## 2013-11-11 MED ORDER — ONDANSETRON HCL 4 MG/2ML IJ SOLN
INTRAMUSCULAR | Status: DC | PRN
  Administered 2013-11-11: 4 mg via INTRAVENOUS

## 2013-11-11 MED ORDER — FENTANYL CITRATE 0.05 MG/ML IJ SOLN
INTRAMUSCULAR | Status: AC
  Filled 2013-11-11: qty 6

## 2013-11-11 MED ORDER — OXYCODONE-ACETAMINOPHEN 5-325 MG PO TABS: 1.0000 | ORAL_TABLET | Freq: Four times a day (QID) | ORAL | Status: DC | PRN

## 2013-11-11 MED ORDER — CEFAZOLIN SODIUM-DEXTROSE 2-3 GM-% IV SOLR
2.0000 g | INTRAVENOUS | Status: AC
  Administered 2013-11-11: 2 g via INTRAVENOUS
  Filled 2013-11-11: qty 50

## 2013-11-11 MED ORDER — DEXAMETHASONE SODIUM PHOSPHATE 4 MG/ML IJ SOLN
INTRAMUSCULAR | Status: DC | PRN
  Administered 2013-11-11: 10 mg via INTRAVENOUS

## 2013-11-11 MED ORDER — FENTANYL CITRATE 0.05 MG/ML IJ SOLN
INTRAMUSCULAR | Status: DC | PRN
  Administered 2013-11-11 (×2): 25 ug via INTRAVENOUS
  Administered 2013-11-11: 50 ug via INTRAVENOUS

## 2013-11-11 MED ORDER — PROMETHAZINE HCL 25 MG/ML IJ SOLN
6.2500 mg | INTRAMUSCULAR | Status: DC | PRN
  Filled 2013-11-11: qty 1

## 2013-11-11 MED ORDER — BELLADONNA ALKALOIDS-OPIUM 16.2-60 MG RE SUPP
RECTAL | Status: DC | PRN
  Administered 2013-11-11: 1 via RECTAL

## 2013-11-11 MED ORDER — LIDOCAINE HCL (CARDIAC) 20 MG/ML IV SOLN
INTRAVENOUS | Status: DC | PRN
  Administered 2013-11-11: 60 mg via INTRAVENOUS

## 2013-11-11 MED ORDER — LACTATED RINGERS IV SOLN
INTRAVENOUS | Status: DC
  Administered 2013-11-11: 11:00:00 via INTRAVENOUS
  Filled 2013-11-11: qty 1000

## 2013-11-11 MED ORDER — MIDAZOLAM HCL 2 MG/2ML IJ SOLN
INTRAMUSCULAR | Status: AC
  Filled 2013-11-11: qty 2

## 2013-11-11 SURGICAL SUPPLY — 41 items
BAG DRAIN URO-CYSTO SKYTR STRL (DRAIN) ×4 IMPLANT
BASKET LASER NITINOL 1.9FR (BASKET) IMPLANT
BASKET STNLS GEMINI 4WIRE 3FR (BASKET) IMPLANT
BASKET ZERO TIP NITINOL 2.4FR (BASKET) IMPLANT
CANISTER SUCT LVC 12 LTR MEDI- (MISCELLANEOUS) ×4 IMPLANT
CATH CLEAR GEL 3F BACKSTOP (CATHETERS) IMPLANT
CATH INTERMIT  6FR 70CM (CATHETERS) IMPLANT
CATH URET 5FR 28IN CONE TIP (BALLOONS)
CATH URET 5FR 28IN OPEN ENDED (CATHETERS) ×4 IMPLANT
CATH URET 5FR 70CM CONE TIP (BALLOONS) IMPLANT
CATH URET DUAL LUMEN 6-10FR 50 (CATHETERS) ×4 IMPLANT
CLOTH BEACON ORANGE TIMEOUT ST (SAFETY) ×4 IMPLANT
DRAPE CAMERA CLOSED 9X96 (DRAPES) ×4 IMPLANT
ELECT REM PT RETURN 9FT ADLT (ELECTROSURGICAL)
ELECTRODE REM PT RTRN 9FT ADLT (ELECTROSURGICAL) IMPLANT
EXTRACTOR STONE NITINOL NGAGE (UROLOGICAL SUPPLIES) ×4 IMPLANT
FIBER LASER FLEXIVA 200 (UROLOGICAL SUPPLIES) IMPLANT
FIBER LASER FLEXIVA 365 (UROLOGICAL SUPPLIES) IMPLANT
GLOVE BIO SURGEON STRL SZ7 (GLOVE) ×4 IMPLANT
GLOVE ECLIPSE 7.0 STRL STRAW (GLOVE) ×4 IMPLANT
GLOVE INDICATOR 7.5 STRL GRN (GLOVE) ×4 IMPLANT
GOWN PREVENTION PLUS LG XLONG (DISPOSABLE) IMPLANT
GOWN STRL REUS W/ TWL LRG LVL3 (GOWN DISPOSABLE) ×2 IMPLANT
GOWN STRL REUS W/TWL LRG LVL3 (GOWN DISPOSABLE) ×2
GOWN STRL REUS W/TWL XL LVL3 (GOWN DISPOSABLE) ×4 IMPLANT
GUIDEWIRE 0.038 PTFE COATED (WIRE) IMPLANT
GUIDEWIRE ANG ZIPWIRE 038X150 (WIRE) IMPLANT
GUIDEWIRE STR DUAL SENSOR (WIRE) ×4 IMPLANT
IV NS IRRIG 3000ML ARTHROMATIC (IV SOLUTION) ×4 IMPLANT
KIT BALLIN UROMAX 15FX10 (LABEL) IMPLANT
KIT BALLN UROMAX 15FX4 (MISCELLANEOUS) IMPLANT
KIT BALLN UROMAX 26 75X4 (MISCELLANEOUS)
PACK CYSTOSCOPY (CUSTOM PROCEDURE TRAY) ×4 IMPLANT
SET HIGH PRES BAL DIL (LABEL)
SHEATH ACCESS URETERAL 24CM (SHEATH) ×4 IMPLANT
SHEATH ACCESS URETERAL 38CM (SHEATH) IMPLANT
SHEATH ACCESS URETERAL 54CM (SHEATH) IMPLANT
SHEATH URET ACCESS 12FR/35CM (UROLOGICAL SUPPLIES) IMPLANT
SHEATH URET ACCESS 12FR/55CM (UROLOGICAL SUPPLIES) IMPLANT
STENT POLARIS 5FRX24 (STENTS) ×4 IMPLANT
SYRINGE IRR TOOMEY STRL 70CC (SYRINGE) IMPLANT

## 2013-11-11 NOTE — Op Note (Signed)
Urology Operative Report  Date of Procedure: 11/11/13  Surgeon: Natalia Leatherwoodaniel Dyamon Sosinski, MD Assistant:  None  Preoperative Diagnosis: Right ureter stone.  Postoperative Diagnosis: Right ureter stone. Right distal ureter stenosis.  Procedure(s): Right ureteroscopy with stone removal. Right retrograde pyelogram with interpretation. Right ureter stent placement (5 x 24 polaris, no tether). Cystoscopy.  Estimated blood loss: None  Specimen: Stone sent to [facility] lab for chemical analysis.  Drains: None  Complications: None  Findings: Right distal ureter stenosis. Right ureter stone.  History of present illness: 58 year old female presents today for right distal ureter stent. This been present for several months. She elected to proceed with ureteroscopy.   Procedure in detail: After informed consent was obtained, the patient was taken to the operating room. They were placed in the supine position. SCDs were turned on and in place. IV antibiotics were infused, and general anesthesia was induced. A timeout was performed in which the correct patient, surgical site, and procedure were identified and agreed upon by the team.  The patient was placed in a dorsolithotomy position, making sure to pad all pertinent neurovascular pressure points. A belladonna suppository was placed into the rectum. The genitals were prepped and draped in the usual sterile fashion.  The patient has history of a urethral sling, and therefore a well-lubricated cystoscope was advanced through the urethra very gently and this passed with ease. Bladder was drained and then fully distended and evaluated in a systematic fashion to visualize the entire surface of the bladder. This was negative for lesions.  Attention was turned to the right ureter orifice. I obtained a right retrograde pyelogram by cannulating the right ureter orifice with a 5 French ureter catheter and injecting 5 cc of Omnipaque. There was noted to be right distal  ureter stenosis for approximately 2 cm leading to a mildly dilated ureter and right renal pelvis. A filling defect was flushed up into the renal collecting system consistent with a distal stone. This side did not drain well.  The 5 French catheter was removed and a sensor wire was placed up the right ureter orifice into the right renal pelvis on fluoroscopy. A semirigid ureteroscope was attempted passed through the urethra into the bladder, but it would not enter the distal ureter due to stenosis. I then placed a dual-lumen ureter access sheath over the safety wire with ease into the right ureter on fluoroscopy. The dual-lumen access sheath was then removed and I was able to place the semirigid ureter scope into the ureter. I was able to navigate this up to the proximal ureter without any visualization of stone. I then withdrew the semirigid ureteroscope and before removing it placed a second sensor wire through the scope and up into the right renal pelvis.  I then placed a 12-14 ureter access sheath over the working wire under fluoroscopy with ease into the proximal ureter. The obturator and working wire were removed. Flexible digital ureter scope was then passed through the access sheath into the proximal ureter and into the renal collecting system. This was evaluated in a systematic fashion. There was a free floating stone consistent with that which had been seen in the distal ureter. This was grasped with a basket and removed. This was sent to [facility] for chemical analysis. There were no other stone fragments noted.  The ureter scope and access sheath were removed visualizing the entire length of the ureter. There was no disruption of the mucosa, but there was inflammation around the area of stenosis  at the distal ureter and therefore I elected to place a ureter stent.  The safety wire was threaded through a rigid cystoscope and a 5 x 24 polaris stent without tether was placed over the wire  through the scope, under fluoroscopy and up into the right renal pelvis with ease. This was deployed with a good curl in the right renal pelvis and the loops in the bladder. The bladder was drained and this completed the procedure the cystoscope was removed.  I placed 10 cc of lidocaine jelly into the patient's urethra, she's placed back in a supine position, anesthesia was reversed, and she was taken to the [facility] in stable condition.  All counts were correct at the end of the case.  To be given Keflex to begin the day before stent removal in clinic.

## 2013-11-11 NOTE — Transfer of Care (Signed)
Immediate Anesthesia Transfer of Care Note  Patient: Alicia Mcfarland  Procedure(s) Performed: Procedure(s) (LRB): CYSTOSCOPY WITH RETROGRADE PYELOGRAM, URETEROSCOPY AND STENT PLACEMENT (Right)  Patient Location: PACU  Anesthesia Type: General  Level of Consciousness: awake, oriented, sedated and patient cooperative  Airway & Oxygen Therapy: Patient Spontanous Breathing and Patient connected to face mask oxygen  Post-op Assessment: Report given to PACU RN and Post -op Vital signs reviewed and stable  Post vital signs: Reviewed and stable  Complications: No apparent anesthesia complications

## 2013-11-11 NOTE — Discharge Instructions (Signed)
DISCHARGE INSTRUCTIONS FOR KIDNEY STONES OR URETERAL STENT ° °MEDICATIONS:  ° °1. Resume all your other meds from home. ° °ACTIVITY °1. No strenuous activity x 1week °2. No driving while on narcotic pain medications °3. Drink plenty of water °4. Continue to walk at home - you can still get blood clots when you are at home, so keep active, but don't over do it. °5. May return to work in 3 days. ° °BATHING °1. You can shower or bath. ° ° ° °SIGNS/SYMPTOMS TO CALL: °1. Please call us if you have a fever greater than 101.5, uncontrolled  °nausea/vomiting, uncontrolled pain, dizziness, unable to urinate, chest pain, shortness of breath, leg swelling, leg pain, redness around wound, drainage from wound, or any other concerns or questions. ° °You can reach us at [phone]. ° ° °Post Anesthesia Home Care Instructions ° °Activity: °Get plenty of rest for the remainder of the day. A responsible adult should stay with you for 24 hours following the procedure.  °For the next 24 hours, DO NOT: °-Drive a car °-Operate machinery °-Drink alcoholic beverages °-Take any medication unless instructed by your physician °-Make any legal decisions or sign important papers. ° °Meals: °Start with liquid foods such as gelatin or soup. Progress to regular foods as tolerated. Avoid greasy, spicy, heavy foods. If nausea and/or vomiting occur, drink only clear liquids until the nausea and/or vomiting subsides. Call your physician if vomiting continues. ° °Special Instructions/Symptoms: °Your throat may feel dry or sore from the anesthesia or the breathing tube placed in your throat during surgery. If this causes discomfort, gargle with warm salt water. The discomfort should disappear within 24 hours. ° ° ° °

## 2013-11-11 NOTE — Anesthesia Procedure Notes (Signed)
Procedure Name: LMA Insertion Date/Time: 11/11/2013 11:33 AM Performed by: Renella Cunas D Pre-anesthesia Checklist: Patient identified, Emergency Drugs available, Suction available and Patient being monitored Patient Re-evaluated:Patient Re-evaluated prior to inductionOxygen Delivery Method: Circle System Utilized Preoxygenation: Pre-oxygenation with 100% oxygen Intubation Type: IV induction Ventilation: Mask ventilation without difficulty LMA: LMA inserted LMA Size: 4.0 Number of attempts: 1 Airway Equipment and Method: bite block Placement Confirmation: positive ETCO2 Tube secured with: Tape Dental Injury: Teeth and Oropharynx as per pre-operative assessment

## 2013-11-11 NOTE — Anesthesia Preprocedure Evaluation (Signed)
Anesthesia Evaluation  Patient identified by MRN, date of birth, ID band Patient awake    Reviewed: Allergy & Precautions, H&P , NPO status , Patient's Chart, lab work & pertinent test results  Airway Mallampati: II TM Distance: >3 FB Neck ROM: Full    Dental no notable dental hx.    Pulmonary former smoker,  breath sounds clear to auscultation  Pulmonary exam normal       Cardiovascular Exercise Tolerance: Good negative cardio ROS  Rhythm:Regular Rate:Normal     Neuro/Psych negative neurological ROS  negative psych ROS   GI/Hepatic Neg liver ROS, GERD-  Medicated,  Endo/Other  negative endocrine ROS  Renal/GU Renal disease  negative genitourinary   Musculoskeletal negative musculoskeletal ROS (+)   Abdominal (+) + obese,   Peds negative pediatric ROS (+)  Hematology negative hematology ROS (+)   Anesthesia Other Findings   Reproductive/Obstetrics negative OB ROS                           Anesthesia Physical Anesthesia Plan  ASA: II  Anesthesia Plan: General   Post-op Pain Management:    Induction: Intravenous  Airway Management Planned: LMA  Additional Equipment:   Intra-op Plan:   Post-operative Plan: Extubation in OR  Informed Consent: I have reviewed the patients History and Physical, chart, labs and discussed the procedure including the risks, benefits and alternatives for the proposed anesthesia with the patient or authorized representative who has indicated his/her understanding and acceptance.   Dental advisory given  Plan Discussed with: CRNA  Anesthesia Plan Comments:         Anesthesia Quick Evaluation

## 2013-11-12 ENCOUNTER — Encounter (HOSPITAL_BASED_OUTPATIENT_CLINIC_OR_DEPARTMENT_OTHER): Payer: Self-pay | Admitting: Urology

## 2013-11-12 NOTE — Anesthesia Postprocedure Evaluation (Signed)
  Anesthesia Post-op Note  Patient: Alicia Mcfarland  Procedure(s) Performed: Procedure(s) (LRB): CYSTOSCOPY WITH RETROGRADE PYELOGRAM, URETEROSCOPY AND STENT PLACEMENT (Right)  Patient Location: PACU  Anesthesia Type: General  Level of Consciousness: awake and alert   Airway and Oxygen Therapy: Patient Spontanous Breathing  Post-op Pain: mild  Post-op Assessment: Post-op Vital signs reviewed, Patient's Cardiovascular Status Stable, Respiratory Function Stable, Patent Airway and No signs of Nausea or vomiting  Last Vitals:  Filed Vitals:   11/11/13 1325  BP: 126/63  Pulse: 60  Temp: 36.7 C  Resp: 16    Post-op Vital Signs: stable   Complications: No apparent anesthesia complications

## 2014-05-27 IMAGING — CT CT ABD-PELV W/O CM
1 series · 14 of 19 positions shown, 19 images · non-contrast
Comparison: Prior CT from 07/27/2013

ADDENDUM:
There is an error within the impression portion of the report. The
first line of the impression should read 4 mm obstructive stone
within the distal right ureter with secondary moderate right
hydroureteronephrosis. The word "or" in the initial report is a
dictation error.
CLINICAL DATA: Right flank pain.

EXAM:
CT ABDOMEN AND PELVIS WITHOUT CONTRAST
TECHNIQUE: Multidetector CT imaging of the abdomen and pelvis was performed
following the standard protocol without IV contrast.

[Series 6: lung · axial · 0.74mm/px · z∈[+1378,+1458]mm · 14 of 19 slices shown, 19 images]
[im 2/19  soft-tissue]
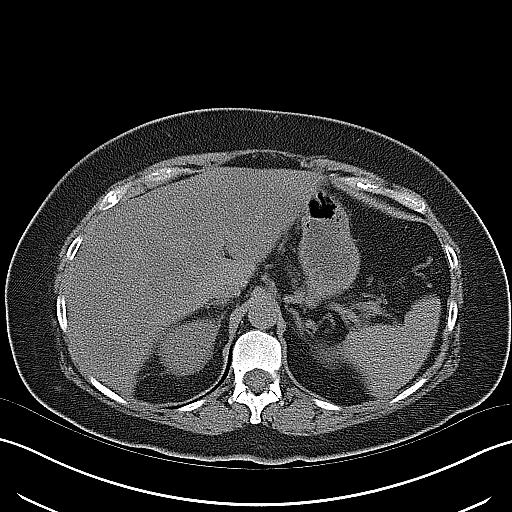
[im 2/19  bone]
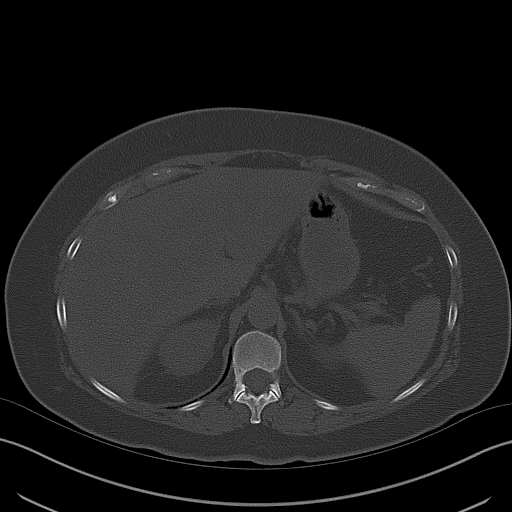
[im 3/19  soft-tissue]
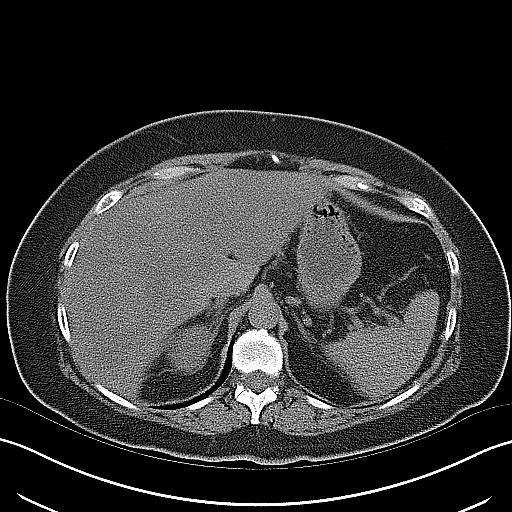
[im 5/19  soft-tissue]
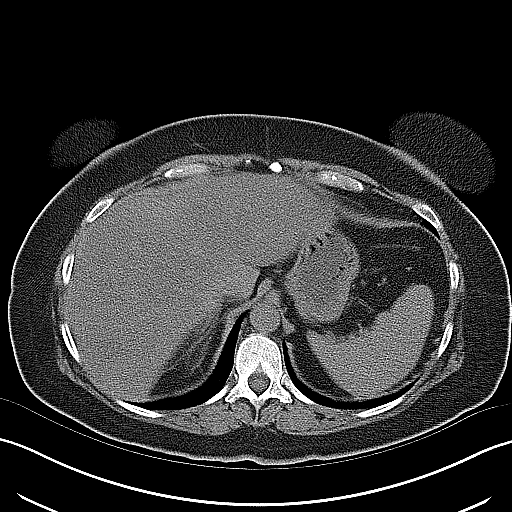
[im 6/19  soft-tissue]
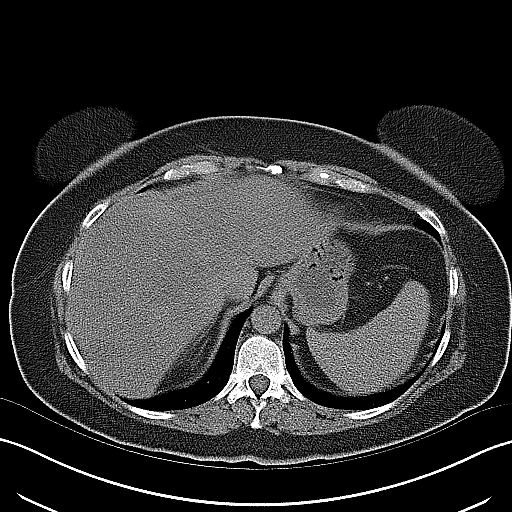
[im 7/19  soft-tissue]
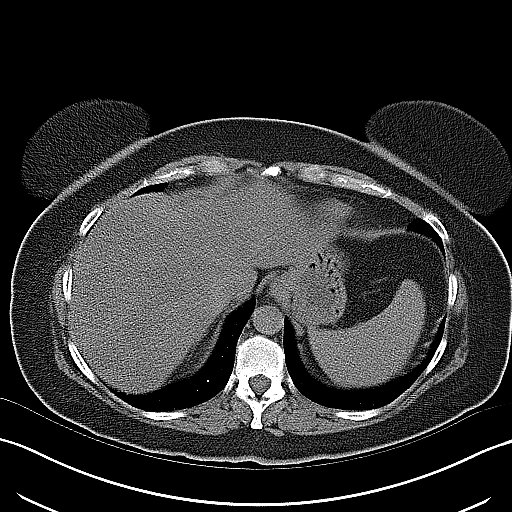
[im 9/19  soft-tissue]
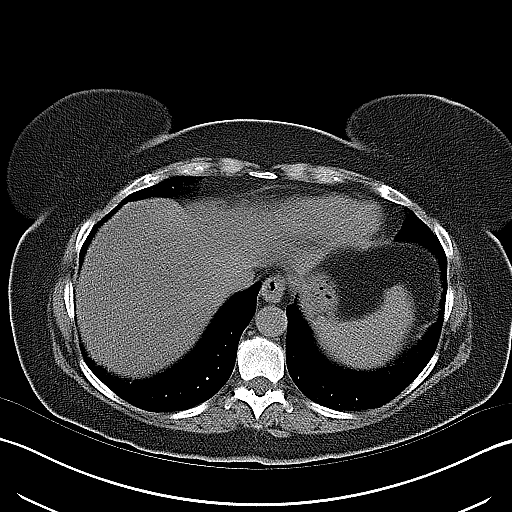
[im 10/19  soft-tissue]
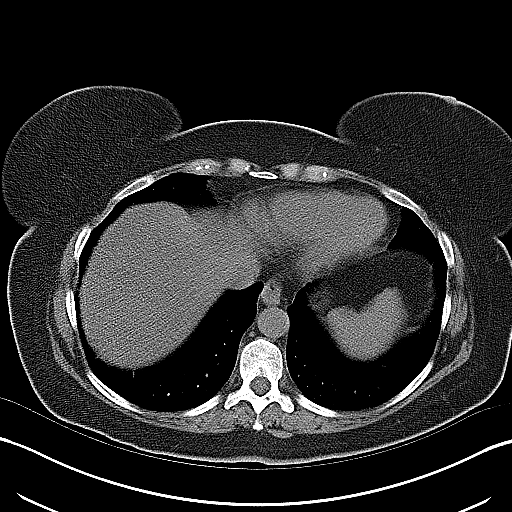
[im 11/19  soft-tissue]
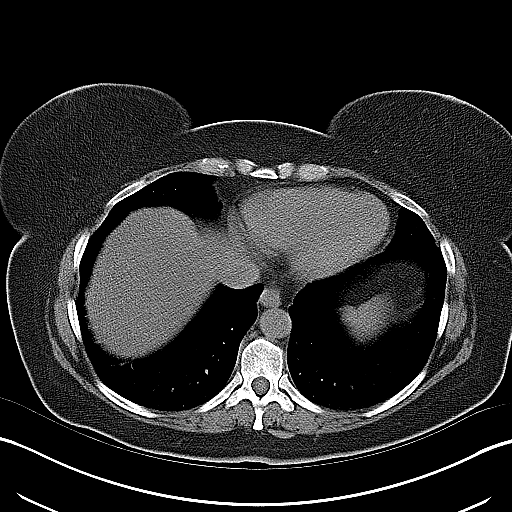
[im 13/19  soft-tissue]
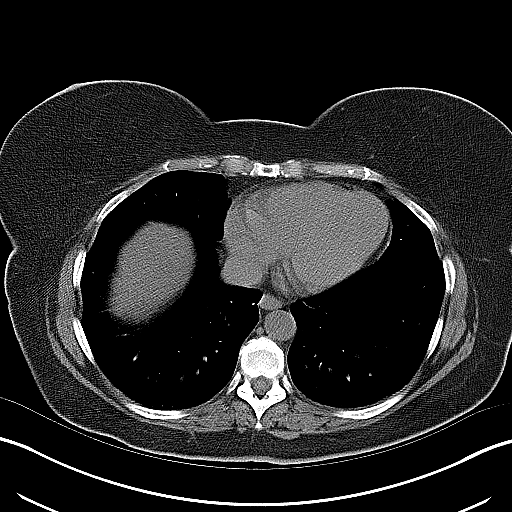
[im 13/19  bone]
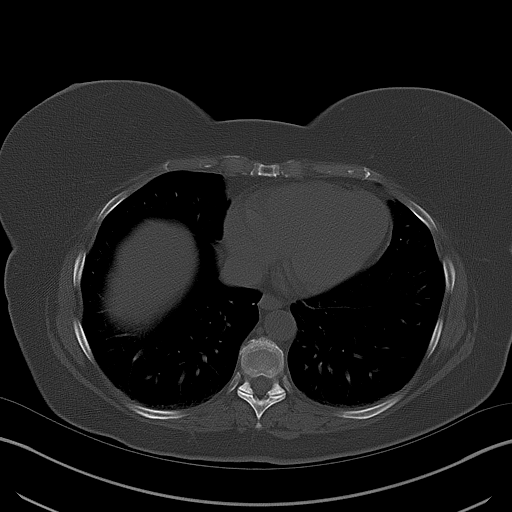
[im 14/19  soft-tissue]
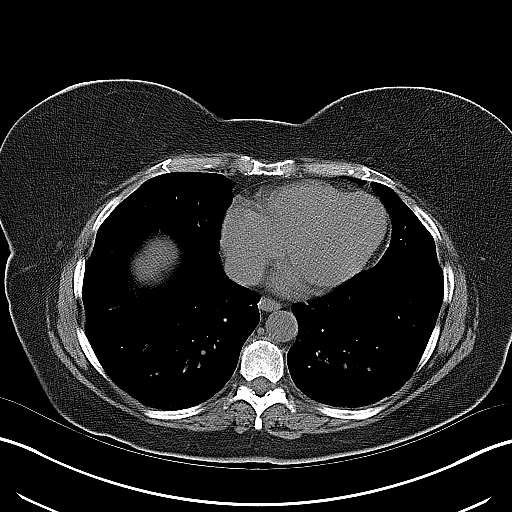
[im 15/19  soft-tissue]
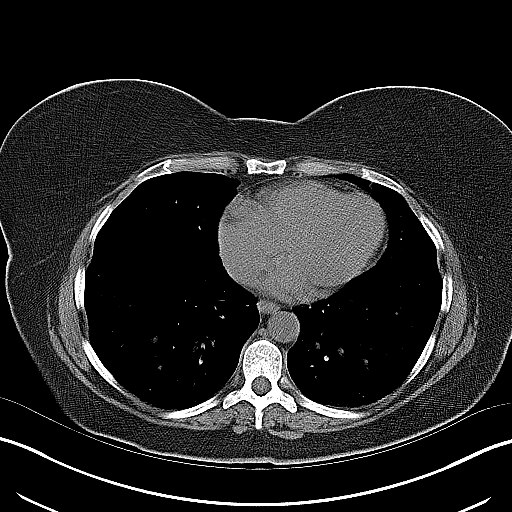
[im 15/19  lung]
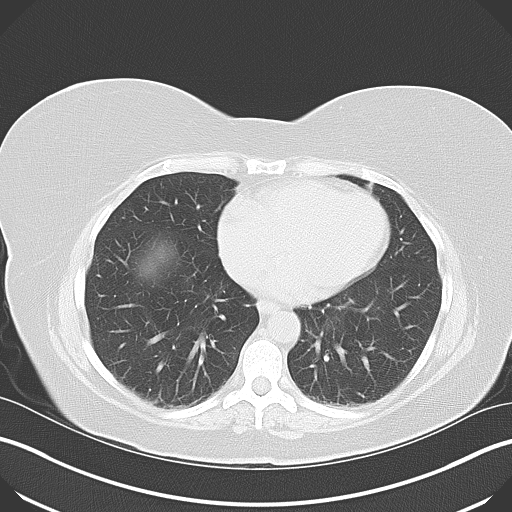
[im 16/19  lung]
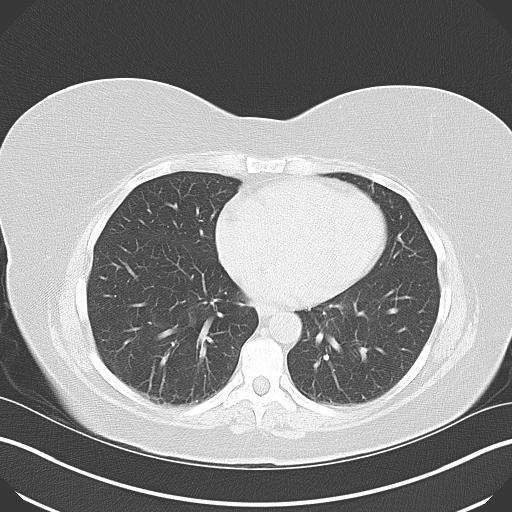
[im 17/19  soft-tissue]
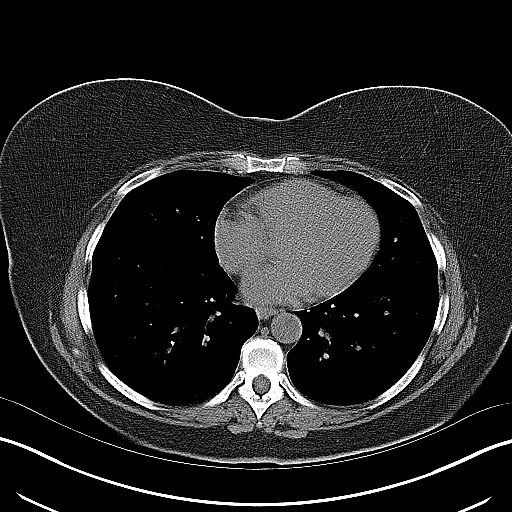
[im 17/19  lung]
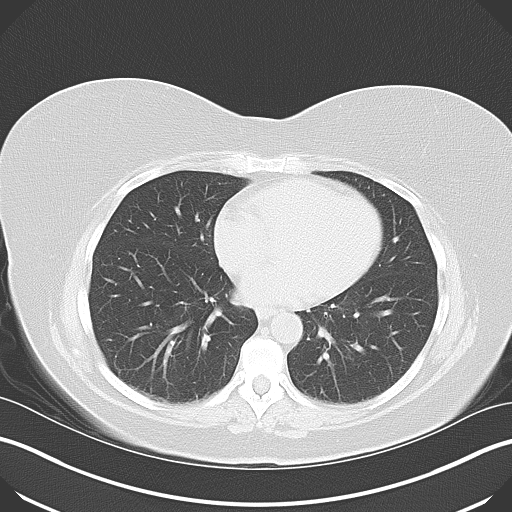
[im 18/19  soft-tissue]
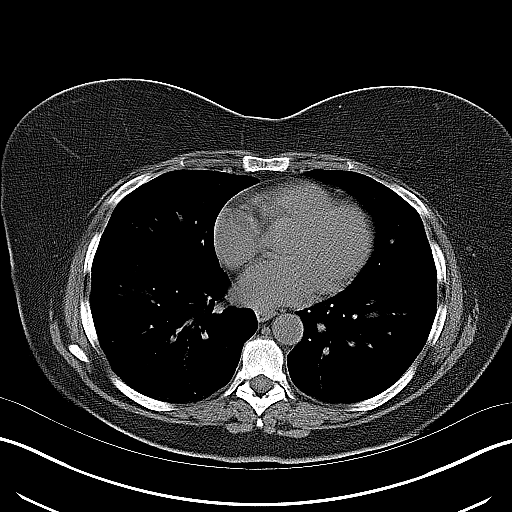
[im 18/19  lung]
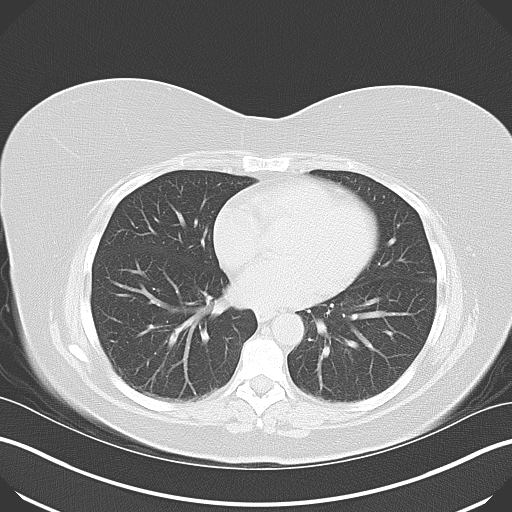

[14 of 19 positions shown; findings below may reference images not displayed]

FINDINGS: The visualized lung bases are clear.

Diffuse hypoattenuation of the liver is compatible with steatosis.
Gallbladder is within normal limits. No biliary ductal dilatation.
The spleen, adrenal glands, and pancreas demonstrate a normal
unenhanced appearance. There is an AA and 4 mm obstructive stone
within the distal right ureter (series 2, image 72), approximately 1
cm proximal to the right UVJ. There is secondary moderate right
hydroureteronephrosis. There is associated perinephric and
periureteral fat stranding.

3 mm nonobstructive stone present within the lower pole of the left
kidney. A few additional punctate calcific density seen within the
interpolar left kidney may represent additional small stones. No
stones seen along the course of the left renal collecting system.
There is no left-sided hydronephrosis or hydroureter.

No evidence of bowel obstruction. No inflammatory changes seen about
the bowels. Appendix is normal. Sigmoid diverticulosis without acute
diverticulitis.

Bladder is within normal limits.

Uterus and ovaries are not visualized.

No free air or fluid. No enlarged intra-abdominal pelvic lymph
nodes.

No acute osseous abnormality. No worrisome lytic or blastic osseous
lesions.
IMPRESSION: 1. 4 mm or obstructive stone within the distal right ureter with
secondary moderate right hydroureteronephrosis.
2. 3 mm nonobstructive left renal calculus.
3. Sigmoid diverticulosis without acute diverticulitis.
4. Hepatic steatosis.

## 2017-04-22 ENCOUNTER — Ambulatory Visit: Payer: Self-pay

## 2017-04-22 ENCOUNTER — Ambulatory Visit (INDEPENDENT_AMBULATORY_CARE_PROVIDER_SITE_OTHER): Payer: PRIVATE HEALTH INSURANCE | Admitting: Podiatry

## 2017-04-22 ENCOUNTER — Ambulatory Visit (INDEPENDENT_AMBULATORY_CARE_PROVIDER_SITE_OTHER): Payer: PRIVATE HEALTH INSURANCE

## 2017-04-22 ENCOUNTER — Encounter: Payer: Self-pay | Admitting: Podiatry

## 2017-04-22 VITALS — BP 164/85 | HR 93 | Ht 63.0 in | Wt 168.0 lb

## 2017-04-22 DIAGNOSIS — M76821 Posterior tibial tendinitis, right leg: Secondary | ICD-10-CM

## 2017-04-22 DIAGNOSIS — M25571 Pain in right ankle and joints of right foot: Secondary | ICD-10-CM

## 2017-04-22 DIAGNOSIS — M7672 Peroneal tendinitis, left leg: Secondary | ICD-10-CM

## 2017-04-22 MED ORDER — METHYLPREDNISOLONE 4 MG PO TBPK: ORAL_TABLET | ORAL | 0 refills | Status: DC

## 2017-04-22 MED ORDER — MELOXICAM 15 MG PO TABS: 15.0000 mg | ORAL_TABLET | Freq: Every day | ORAL | 1 refills | Status: DC

## 2017-04-22 NOTE — Progress Notes (Signed)
   Subjective:    Patient ID: Alicia Mcfarland, female    DOB: 1956/04/05, 61 y.o.   MRN: [id number]  HPI  Chief Complaint  Patient presents with  . Foot Injury    slipped on ice in March 2018 and twisted Right ankle/ took a bad ste at a construction site x 8 weeks ago and has had pain  and swelling since then  . Foot Pain    chronic Left foot pain along the distal edge       Review of Systems  All other systems reviewed and are negative.      Objective:   Physical Exam        Assessment & Plan:

## 2017-04-25 NOTE — Progress Notes (Signed)
    HPI: 61 year old female presenting today with a complaint of pain to the right foot and ankle. She also reports pain to the lateral side of the left foot. She states she initially injured the right ankle about 8 months ago when she slipped on ice and twisted it. She states she stepped the wrong way at a construction site about 8 weeks ago which exacerbated the pain. She reports associated swelling of the right ankle. She has not done anything to treat the symptoms. Walking increases the pain. She denies alleviating factors. She is here for further evaluation and treatment.     Past Medical History:  Diagnosis Date  . GERD (gastroesophageal reflux disease)   . Renal calculus, left   . Right ureteral stone   . Sigmoid diverticulosis   . Wears contact lenses        Physical Exam: General: The patient is alert and oriented x3 in no acute distress.  Dermatology: Skin is warm, dry and supple bilateral lower extremities. Negative for open lesions or macerations.  Vascular: Palpable pedal pulses bilaterally. No edema or erythema noted. Capillary refill within normal limits.  Neurological: Epicritic and protective threshold grossly intact bilaterally.   Musculoskeletal Exam: Pain on palpation noted to the posterior tibial tendon of the right foot. Pain with palpation to the peroneal tendons of the left foot. Range of motion within normal limits. Muscle strength 5/5 in all muscle groups bilateral lower extremities.  Radiographic Exam:  Normal osseous mineralization. Joint spaces preserved. No fracture or dislocation identified.    Assessment: 1. Posterior tibial tendinitis right 2. Peroneal tendinitis left   Plan of Care:  1. Patient was evaluated. Radiographs were reviewed today. 2. Injection of 0.5 mL Celestone Soluspan injected into the posterior tibial tendon sheath.  3. Compression anklet dispensed. 4. CAM boot dispensed. Weightbearing as tolerated x 4 weeks. 5. Prescription  for Medrol Dosepak given to patient. 6. Prescription for Meloxicam given to patient.  7. Return to clinic in 4 weeks.  Going [place] 4 [place] to [place].   Felecia ShellingBrent M. Tharun Cappella, DPM [facility]  Dr. Felecia ShellingBrent M. Morrigan Wickens, DPM    [address]                                        [place], [place] [address]                Office [phone]  Fax [phone]

## 2017-05-15 ENCOUNTER — Ambulatory Visit (INDEPENDENT_AMBULATORY_CARE_PROVIDER_SITE_OTHER): Payer: PRIVATE HEALTH INSURANCE | Admitting: Podiatry

## 2017-05-15 DIAGNOSIS — M76821 Posterior tibial tendinitis, right leg: Secondary | ICD-10-CM

## 2017-05-19 NOTE — Progress Notes (Signed)
    HPI: 61 year old female presenting today for follow up evaluation of right PT tendinitis and left peroneal tendintis. She states she has finished taking the Medrol Dose Pak. She is now taking Meloxicam, wearing the CAM boot and compression anklet all of which have helped alleviate her pain. She also reports significant relief after receiveing the injection at the previous visit. Patient is here for further evaluation and treatment.     Past Medical History:  Diagnosis Date  . GERD (gastroesophageal reflux disease)   . Renal calculus, left   . Right ureteral stone   . Sigmoid diverticulosis   . Wears contact lenses        Physical Exam: General: The patient is alert and oriented x3 in no acute distress.  Dermatology: Skin is warm, dry and supple bilateral lower extremities. Negative for open lesions or macerations.  Vascular: Palpable pedal pulses bilaterally. No edema or erythema noted. Capillary refill within normal limits.  Neurological: Epicritic and protective threshold grossly intact bilaterally.   Musculoskeletal Exam: Pain on palpation noted to the posterior tibial tendon of the right foot. Pain with palpation to the peroneal tendons of the left foot. Range of motion within normal limits. Muscle strength 5/5 in all muscle groups bilateral lower extremities.   Assessment: 1. Posterior tibial tendinitis right - improving 2. Peroneal tendinitis left - improving   Plan of Care:  1. Patient was evaluated.  2. Injection of 0.5 mL Celestone Soluspan injected into the posterior tibial tendon sheath.  3. Ankle brace dispensed.  4. Recommended foot shoe gear. 5. Continue taking Meloxicam as needed.  6. Return to clinic in 6 weeks.   [place]   Felecia ShellingBrent M. Evans, DPM [facility]  Dr. Felecia ShellingBrent M. Evans, DPM    [address]                                        [place], [place] [address]                Office [phone]  [phone]  Fax [phone]

## 2017-06-06 ENCOUNTER — Telehealth: Payer: Self-pay | Admitting: *Deleted

## 2017-06-06 NOTE — Telephone Encounter (Signed)
Pt states she feels the trilock brace is too big. I spoke with pt, she states initially she was fitted with a medium trilock, but it was too small. Pt states now that she is home the large is too big and she has to pull up through the day and the foot hurts. I told pt if she would come in tomorrow and bring a sock I would assist fitting her properly. Pt [place] and will come by after 1:00pm.

## 2017-07-03 ENCOUNTER — Encounter: Payer: Self-pay | Admitting: Podiatry

## 2017-07-03 ENCOUNTER — Ambulatory Visit (INDEPENDENT_AMBULATORY_CARE_PROVIDER_SITE_OTHER): Payer: Commercial Managed Care - PPO | Admitting: Podiatry

## 2017-07-03 DIAGNOSIS — M76821 Posterior tibial tendinitis, right leg: Secondary | ICD-10-CM | POA: Diagnosis not present

## 2017-07-07 NOTE — Progress Notes (Signed)
    HPI: 62 year old female presenting today for follow up evaluation of right PT tendinitis and left peroneal tendintis. She states she is doing better but reports some continued pain and tenderness in the right foot. She states the pain radiates to the arch of her foot intermittently. She has been wearing the ankle brace as directed with some relief of the symptoms. Patient is here for further evaluation and treatment.     Past Medical History:  Diagnosis Date  . GERD (gastroesophageal reflux disease)   . Renal calculus, left   . Right ureteral stone   . Sigmoid diverticulosis   . Wears contact lenses        Physical Exam: General: The patient is alert and oriented x3 in no acute distress.  Dermatology: Skin is warm, dry and supple bilateral lower extremities. Negative for open lesions or macerations.  Vascular: Palpable pedal pulses bilaterally. No edema or erythema noted. Capillary refill within normal limits.  Neurological: Epicritic and protective threshold grossly intact bilaterally.   Musculoskeletal Exam: Mild pain on palpation noted to the posterior tibial tendon of the right foot. Pain with palpation to the peroneal tendons of the left foot. Range of motion within normal limits. Muscle strength 5/5 in all muscle groups bilateral lower extremities.   Assessment: 1. Posterior tibial tendinitis right - 95% better 2. Peroneal tendinitis left - 95% better   Plan of Care:  1. Patient was evaluated.  2. Continue taking Mobic as needed. 3. Continue wearing ankle brace with good shoe gear. 4. Return to clinic as needed.    Felecia ShellingBrent M. Renaye Janicki, DPM [facility]  Dr. Felecia ShellingBrent M. Aastha Dayley, DPM    [address]                                        [place], [place] [address]                Office [phone]  Fax [phone]

## 2017-08-23 ENCOUNTER — Other Ambulatory Visit: Payer: Self-pay | Admitting: Podiatry

## 2018-01-01 ENCOUNTER — Other Ambulatory Visit: Payer: Self-pay | Admitting: Podiatry

## 2018-11-24 ENCOUNTER — Other Ambulatory Visit: Payer: Self-pay

## 2018-11-24 ENCOUNTER — Ambulatory Visit (INDEPENDENT_AMBULATORY_CARE_PROVIDER_SITE_OTHER): Payer: Commercial Managed Care - PPO | Admitting: Podiatry

## 2018-11-24 ENCOUNTER — Ambulatory Visit (INDEPENDENT_AMBULATORY_CARE_PROVIDER_SITE_OTHER): Payer: Commercial Managed Care - PPO

## 2018-11-24 DIAGNOSIS — M7751 Other enthesopathy of right foot: Secondary | ICD-10-CM | POA: Diagnosis not present

## 2018-11-24 DIAGNOSIS — M7750 Other enthesopathy of unspecified foot: Secondary | ICD-10-CM | POA: Diagnosis not present

## 2018-11-24 DIAGNOSIS — M7661 Achilles tendinitis, right leg: Secondary | ICD-10-CM | POA: Diagnosis not present

## 2018-11-24 MED ORDER — METHYLPREDNISOLONE 4 MG PO TBPK: ORAL_TABLET | ORAL | 0 refills | Status: DC

## 2018-11-24 MED ORDER — MELOXICAM 15 MG PO TABS: 15.0000 mg | ORAL_TABLET | Freq: Every day | ORAL | 1 refills | Status: DC

## 2018-11-27 NOTE — Progress Notes (Signed)
   HPI: 63 year old female with PMHx of Achilles tendinitis of the right ankle presenting today for follow up evaluation of right ankle pain. She states she was walking on the beach and ran after her dog two months ago when the pain started again. Walking and bearing weight for long periods of time increases the pain. She has not done anything for treatment but has been trying to rest the foot. Patient is here for further evaluation and treatment.   Past Medical History:  Diagnosis Date  . GERD (gastroesophageal reflux disease)   . Renal calculus, left   . Right ureteral stone   . Sigmoid diverticulosis   . Wears contact lenses       Physical Exam: General: The patient is alert and oriented x3 in no acute distress.  Dermatology: Skin is warm, dry and supple bilateral lower extremities. Negative for open lesions or macerations.  Vascular: Palpable pedal pulses bilaterally. No edema or erythema noted. Capillary refill within normal limits.  Neurological: Epicritic and protective threshold grossly intact bilaterally.   Musculoskeletal Exam: Pain on palpation noted to the posterior tubercle of the right calcaneus at the insertion of the Achilles tendon consistent with retrocalcaneal bursitis. Range of motion within normal limits. Muscle strength 5/5 in all muscle groups bilateral lower extremities.  Radiographic Exam:  Posterior calcaneal spur noted to the respective calcaneus on lateral view. No fracture or dislocation noted. Normal osseous mineralization noted.     Assessment: 1. Insertional Achilles tendinitis right - lateral  2. Retrocalcaneal bursitis   Plan of Care:  1. Patient was evaluated. Radiographs were reviewed today. 2. Injection of 0.5 mL Celestone Soluspan injected into the retrocalcaneal bursa. Care was taken to avoid direct injection into the Achilles tendon. 3. Prescription for Medrol Dose Pak provided to patient. 4. Prescription for Meloxicam provided to patient.  5. Resume using CAM boot and compression anklet for three weeks.  6. Return to clinic in 3 weeks.    Edrick Kins, DPM [facility]  Dr. Edrick Kins, [address]                                        [place], [place] [address]                Office [phone]  Fax [phone]

## 2018-12-15 ENCOUNTER — Other Ambulatory Visit: Payer: Self-pay

## 2018-12-15 ENCOUNTER — Ambulatory Visit (INDEPENDENT_AMBULATORY_CARE_PROVIDER_SITE_OTHER): Payer: Commercial Managed Care - PPO | Admitting: Podiatry

## 2018-12-15 ENCOUNTER — Encounter: Payer: Self-pay | Admitting: Podiatry

## 2018-12-15 VITALS — Temp 97.1°F

## 2018-12-15 DIAGNOSIS — M7661 Achilles tendinitis, right leg: Secondary | ICD-10-CM | POA: Diagnosis not present

## 2018-12-17 NOTE — Progress Notes (Signed)
   HPI: 63 year old female presenting today for follow up evaluation of insertional Achilles tendinitis right. She states her pain is improving but has not completely resolved. She states her swelling has improved due to using the CAM boot. She reports some continued tightness in the posterior leg. She has taken the Medrol Dose Pak and has been taking Meloxicam for treatment. Being on the foot for extending periods of time increases the pain. Patient is here for further evaluation and treatment.   Past Medical History:  Diagnosis Date  . GERD (gastroesophageal reflux disease)   . Renal calculus, left   . Right ureteral stone   . Sigmoid diverticulosis   . Wears contact lenses       Physical Exam: General: The patient is alert and oriented x3 in no acute distress.  Dermatology: Skin is warm, dry and supple bilateral lower extremities. Negative for open lesions or macerations.  Vascular: Palpable pedal pulses bilaterally. No edema or erythema noted. Capillary refill within normal limits.  Neurological: Epicritic and protective threshold grossly intact bilaterally.   Musculoskeletal Exam: Pain on palpation noted to the posterior tubercle of the right calcaneus at the insertion of the Achilles tendon consistent with retrocalcaneal bursitis. Range of motion within normal limits. Muscle strength 5/5 in all muscle groups bilateral lower extremities.  Assessment: 1. Insertional Achilles tendinitis right - lateral - improved  2. Retrocalcaneal bursitis   Plan of Care:  1. Patient was evaluated. 2. Order for physical therapy twice weekly for four weeks placed.  3. Transition out of CAM boot back into ankle brace for four week.  4. Return to clinic as needed.   Has a beach house at [facility].   Edrick Kins, DPM [facility]  Dr. Edrick Kins, [address]                                        [place], [place] [address]                Office [phone]  Fax [phone]

## 2019-01-21 ENCOUNTER — Other Ambulatory Visit: Payer: Self-pay

## 2019-01-21 MED ORDER — MELOXICAM 15 MG PO TABS: 15.0000 mg | ORAL_TABLET | Freq: Every day | ORAL | 1 refills | Status: DC

## 2019-01-21 NOTE — Progress Notes (Signed)
Rx for meloxicam 15mg  30tab

## 2019-08-28 ENCOUNTER — Ambulatory Visit: Payer: PRIVATE HEALTH INSURANCE | Attending: Internal Medicine

## 2019-08-28 DIAGNOSIS — Z23 Encounter for immunization: Secondary | ICD-10-CM

## 2019-08-28 NOTE — Progress Notes (Signed)
   Covid-19 Vaccination Clinic  Name:  Alicia Mcfarland    MRN: [id number] DOB: 23-Apr-1956  08/28/2019  Alicia Mcfarland was observed post Covid-19 immunization for 15 minutes without incident. She was provided with Vaccine Information Sheet and instruction to access the V-Safe system.   Alicia Mcfarland was instructed to call 911 with any severe reactions post vaccine: Marland Kitchen Difficulty breathing  . Swelling of face and throat  . A fast heartbeat  . A bad rash all over body  . Dizziness and weakness   Immunizations Administered    Name Date Dose VIS Date Route   Pfizer COVID-19 Vaccine 08/28/2019  8:54 AM 0.3 mL 05/29/2019 Intramuscular   Manufacturer: [employer], [employer]   Lot: [id number]   NDC: [address]-3

## 2019-09-21 ENCOUNTER — Ambulatory Visit: Payer: PRIVATE HEALTH INSURANCE | Attending: Internal Medicine

## 2019-09-21 DIAGNOSIS — Z23 Encounter for immunization: Secondary | ICD-10-CM

## 2019-09-21 NOTE — Progress Notes (Signed)
   Covid-19 Vaccination Clinic  Name:  GENIVA LOHNES    MRN: [id number] DOB: April 07, 1956  09/21/2019  Ms. Sheperd was observed post Covid-19 immunization for 15 minutes without incident. She was provided with Vaccine Information Sheet and instruction to access the V-Safe system.   Ms. Maclin was instructed to call 911 with any severe reactions post vaccine: Marland Kitchen Difficulty breathing  . Swelling of face and throat  . A fast heartbeat  . A bad rash all over body  . Dizziness and weakness   Immunizations Administered    Name Date Dose VIS Date Route   Pfizer COVID-19 Vaccine 09/21/2019 11:53 AM 0.3 mL 05/29/2019 Intramuscular   Manufacturer: [employer], [employer]   Lot: [id number]   NDC: [address]-9

## 2021-06-28 ENCOUNTER — Other Ambulatory Visit: Payer: Self-pay

## 2021-06-28 ENCOUNTER — Encounter (HOSPITAL_COMMUNITY): Payer: Self-pay

## 2021-06-28 ENCOUNTER — Ambulatory Visit (HOSPITAL_COMMUNITY)
Admission: EM | Admit: 2021-06-28 | Discharge: 2021-06-28 | Disposition: A | Discharge status: Home / Self Care | Payer: PRIVATE HEALTH INSURANCE

## 2021-06-28 DIAGNOSIS — J189 Pneumonia, unspecified organism: Secondary | ICD-10-CM

## 2021-06-28 DIAGNOSIS — J309 Allergic rhinitis, unspecified: Secondary | ICD-10-CM | POA: Diagnosis not present

## 2021-06-28 MED ORDER — PREDNISONE 10 MG (21) PO TBPK: ORAL_TABLET | Freq: Every day | ORAL | 0 refills | Status: DC

## 2021-06-28 MED ORDER — ALBUTEROL SULFATE HFA 108 (90 BASE) MCG/ACT IN AERS
INHALATION_SPRAY | RESPIRATORY_TRACT | Status: AC
  Filled 2021-06-28: qty 6.7

## 2021-06-28 MED ORDER — AEROCHAMBER PLUS FLO-VU MEDIUM MISC
1.0000 | Freq: Once | Status: AC
  Administered 2021-06-28: 1

## 2021-06-28 MED ORDER — AZITHROMYCIN 250 MG PO TABS: 250.0000 mg | ORAL_TABLET | Freq: Every day | ORAL | 0 refills | Status: DC

## 2021-06-28 MED ORDER — AEROCHAMBER PLUS FLO-VU LARGE MISC
Status: AC
  Filled 2021-06-28: qty 1

## 2021-06-28 MED ORDER — ALBUTEROL SULFATE HFA 108 (90 BASE) MCG/ACT IN AERS
2.0000 | INHALATION_SPRAY | Freq: Once | RESPIRATORY_TRACT | Status: AC
  Administered 2021-06-28: 2 via RESPIRATORY_TRACT

## 2021-06-28 NOTE — ED Provider Notes (Signed)
[facility]    CSN: [id number] Arrival date & time: 06/28/21  [id number]      History   Chief Complaint Chief Complaint  Patient presents with   Cough    HPI Alicia Mcfarland is a 66 y.o. female. She reports around 1 month of nasal congestion, post-nasal drainage, cough. She thinks it was allergies as she works outside in the yard often. Has not been taking her flonase. In the last week, she has begun coughing up yellow and brown sputum and feeling winded. Denies wheezing. Nasal drainage is clear. Has had low grade fever for the last 2 days, T max 100.73F  Separately, she was recently dx with UTI and is taking macrobid, has 3 days left   Cough Associated symptoms: chills, fever, rhinorrhea and shortness of breath   Associated symptoms: no sore throat and no wheezing    Past Medical History:  Diagnosis Date   GERD (gastroesophageal reflux disease)    Renal calculus, left    Right ureteral stone    Sigmoid diverticulosis    Wears contact lenses     There are no problems to display for this patient.   Past Surgical History:  Procedure Laterality Date   CYSTOSCOPY WITH RETROGRADE PYELOGRAM, URETEROSCOPY AND STENT PLACEMENT Right 11/11/2013   Procedure: CYSTOSCOPY WITH RETROGRADE PYELOGRAM, URETEROSCOPY AND STENT PLACEMENT;  Surgeon: Magdalene Mollyaniel Y Woodruff, MD;  [facility];  Service: Urology;  Laterality: Right;   LAPAROSCOPIC ASSISTED VAGINAL HYSTERECTOMY  04-26-2010   W/  BILATERAL SALPINGOOPHORECTOMY/  TVT SLING/  ANTERIOR REPAIR   UMBILICAL HERNIA REPAIR  6 MONTHS OLD    OB History   No obstetric history on file.      Home Medications    Prior to Admission medications   Medication Sig Start Date End Date Taking? Authorizing Provider  azithromycin (ZITHROMAX) 250 MG tablet Take 1 tablet (250 mg total) by mouth daily. Take first 2 tablets together, then 1 every day until finished. 06/28/21  Yes Cathlyn ParsonsKabbe, Savannah Erbe M, NP  predniSONE (STERAPRED  UNI-PAK 21 TAB) 10 MG (21) TBPK tablet Take by mouth daily. Take 6 tabs by mouth daily  for 2 days, then 5 tabs for 2 days, then 4 tabs for 2 days, then 3 tabs for 2 days, 2 tabs for 2 days, then 1 tab by mouth daily for 2 days 06/28/21  Yes Lakeysha Slutsky, Marzella SchleinAngela M, NP  calcium carbonate (TUMS EX) 750 MG chewable tablet Chew 1 tablet by mouth 2 (two) times daily as needed for heartburn. Acid reflex    [provider]  diphenhydramine-acetaminophen (TYLENOL PM) 25-500 MG TABS Take 1 tablet by mouth at bedtime as needed (sleep).    [provider]  estrogens, conjugated, (PREMARIN) 0.625 MG tablet Take 0.625 mg by mouth at bedtime.    [provider]  fluticasone (FLONASE) 50 MCG/ACT nasal spray Place 1 spray into both nostrils daily as needed for allergies or rhinitis.    [provider]  ibuprofen (ADVIL,MOTRIN) 200 MG tablet Take 400 mg by mouth every 6 (six) hours as needed for moderate pain.    [provider]  nitrofurantoin, macrocrystal-monohydrate, (MACROBID) 100 MG capsule Macrobid 100 mg capsule  Take 1 capsule every 12 hours by oral route for 7 days.    [provider]  ondansetron (ZOFRAN ODT) 4 MG disintegrating tablet Take 1 tablet (4 mg total) by mouth every 8 (eight) hours as needed for nausea or vomiting. 10/22/13   Piepenbrink, Victorino DikeJennifer,  PA-C    Family History History reviewed. No pertinent family history.  Social History Social History   Tobacco Use   Smoking status: Former    Packs/day: 0.75    Years: 6.00    Pack years: 4.50    Types: Cigarettes    Quit date: 11/06/1983    Years since quitting: 37.6   Smokeless tobacco: Never  Substance Use Topics   Alcohol use: Yes    Comment: rare   Drug use: No     Allergies   Iodine, Shellfish allergy, and Avocado   Review of Systems Review of Systems  Constitutional:  Positive for chills and fever.  HENT:  Positive for congestion, postnasal drip, rhinorrhea and voice change.  Negative for sinus pressure, sinus pain and sore throat.   Respiratory:  Positive for cough and shortness of breath. Negative for wheezing.     Physical Exam Triage Vital Signs ED Triage Vitals  Enc Vitals Group     BP 06/28/21 1013 139/78     Pulse Rate 06/28/21 1013 (!) 102     Resp 06/28/21 1013 18     Temp 06/28/21 1013 99 F (37.2 C)     Temp Source 06/28/21 1013 Oral     SpO2 06/28/21 1013 99 %     Weight --      Height --      Head Circumference --      Peak Flow --      Pain Score 06/28/21 1014 0     Pain Loc --      Pain Edu? --      Excl. in GC? --    No data found.  Updated Vital Signs BP 139/78 (BP Location: Left Arm)    Pulse (!) 102    Temp 99 F (37.2 C) (Oral)    Resp 18    SpO2 99%   Visual Acuity Right Eye Distance:   Left Eye Distance:   Bilateral Distance:    Right Eye Near:   Left Eye Near:    Bilateral Near:     Physical Exam Constitutional:      General: She is not in acute distress.    Appearance: Normal appearance. She is not ill-appearing.  HENT:     Right Ear: Tympanic membrane, ear canal and external ear normal.     Left Ear: Ear canal and external ear normal.     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     Mouth/Throat:     Mouth: Mucous membranes are moist.     Pharynx: Oropharynx is clear.  Cardiovascular:     Rate and Rhythm: Normal rate and regular rhythm.  Pulmonary:     Effort: Pulmonary effort is normal.     Breath sounds: Normal breath sounds.     Comments: Cough observed during H&P Neurological:     Mental Status: She is alert.     UC Treatments / Results  Labs (all labs ordered are listed, but only abnormal results are displayed) Labs Reviewed - No data to display  EKG   Radiology No results found.  Procedures Procedures (including critical care time)  Medications Ordered in UC Medications  albuterol (VENTOLIN HFA) 108 (90 Base) MCG/ACT  inhaler 2 puff (2 puffs Inhalation Given 06/28/21 1055)  AeroChamber Plus Flo-Vu Medium MISC 1 each (1 each Other Given 06/28/21 1056)    Initial Impression / Assessment  and Plan / UC Course  I have reviewed the triage vital signs and the nursing notes.  Pertinent labs & imaging results that were available during my care of the patient were reviewed by me and considered in my medical decision making (see chart for details).    Pt given  Albuterol with spacer to use here in urgent care.  Reports after using it she feels less winded.  We will treat empirically for community-acquired pneumonia.  Given prescription for Zithromax and prednisone.  Reviewed use of albuterol inhaler at home.  Encourage patient to treat allergic rhinitis but using Flonase daily.  Patient to also add Mucinex and nasal saline spray to her treatment.   Final diagnoses:  Community acquired pneumonia, unspecified laterality  Allergic rhinitis, unspecified seasonality, unspecified trigger     Discharge Instructions      Use the albuterol inhaler with a spacer 2 puffs every 4-6 hours as needed for chest tightness, shortness of breath, or wheezing.  Use Mucinex (generic guaifenesin is okay to use) to help thin out your secretions and help make it easier to cough up congestion.  Resume using your Flonase on a daily basis.  And saline nasal spray several times a day (at a different time than the Flonase) to help your congestion drain      ED Prescriptions     Medication Sig Dispense Auth. Provider   azithromycin (ZITHROMAX) 250 MG tablet Take 1 tablet (250 mg total) by mouth daily. Take first 2 tablets together, then 1 every day until finished. 6 tablet Cathlyn Parsons, NP   predniSONE (STERAPRED UNI-PAK 21 TAB) 10 MG (21) TBPK tablet Take by mouth daily. Take 6 tabs by mouth daily  for 2 days, then 5 tabs for 2 days, then 4 tabs for 2 days, then 3 tabs for 2 days, 2 tabs for 2 days, then 1 tab by mouth daily for 2  days 42 tablet Blaire Hodsdon, Marzella Schlein, NP      PDMP not reviewed this encounter.   Cathlyn Parsons, NP 06/28/21 1117

## 2021-06-28 NOTE — Discharge Instructions (Addendum)
Use the albuterol inhaler with a spacer 2 puffs every 4-6 hours as needed for chest tightness, shortness of breath, or wheezing.  Use Mucinex (generic guaifenesin is okay to use) to help thin out your secretions and help make it easier to cough up congestion.  Resume using your Flonase on a daily basis.  And saline nasal spray several times a day (at a different time than the Flonase) to help your congestion drain

## 2021-06-28 NOTE — ED Triage Notes (Signed)
Pt c/o cough and chest congestion x2 days. States is currently on abx for a UTI.

## 2021-11-22 ENCOUNTER — Encounter (HOSPITAL_COMMUNITY): Payer: Self-pay

## 2021-11-22 ENCOUNTER — Ambulatory Visit (HOSPITAL_COMMUNITY)
Admission: RE | Admit: 2021-11-22 | Discharge: 2021-11-22 | Disposition: A | Discharge status: Home / Self Care | Payer: Commercial Managed Care - PPO | Source: Ambulatory Visit | Attending: Family Medicine | Admitting: Family Medicine

## 2021-11-22 VITALS — BP 132/81 | HR 87 | Temp 98.3°F | Resp 16

## 2021-11-22 DIAGNOSIS — J020 Streptococcal pharyngitis: Secondary | ICD-10-CM | POA: Diagnosis not present

## 2021-11-22 LAB — POCT RAPID STREP A, ED / UC: Streptococcus, Group A Screen (Direct): POSITIVE — AB

## 2021-11-22 MED ORDER — PENICILLIN V POTASSIUM 500 MG PO TABS: 500.0000 mg | ORAL_TABLET | Freq: Three times a day (TID) | ORAL | 0 refills | Status: DC

## 2021-11-22 NOTE — ED Triage Notes (Signed)
Pt reports sore throat since Saturday. Denies taking any OTC medication, tried salt water gargles with little relief.

## 2021-11-22 NOTE — Discharge Instructions (Addendum)
As we discussed, you do have strep throat.  We will treat you with penicillin for the next 10 days.  If you are not having improvement in your symptoms, you can come back here or see your primary care doctor.  In the meantime, honey or Chloraseptic spray can be helpful to soothe your throat.  After you have been on an antibiotic for 24 hours, you are no longer infectious.

## 2021-11-22 NOTE — ED Provider Notes (Signed)
[facility]    CSN: [id number] Arrival date & time: 11/22/21  1152      History   Chief Complaint Chief Complaint  Patient presents with   Sore Throat    HPI Alicia Mcfarland is a 66 y.o. female.   Sore throat Started 3 days ago No fevers  Endorses sore throat and mouth soreness Denies congestion, rhinorrhea, headache, fatigue, myalgias, nausea, vomiting, diarrhea, chest pain, shortness of breath Has been drinking normally, has normal UOP  No known sick contacts, but cares for granddaughter and strep has been going through daycare Granddaughter hasn't had any recent rashes or illnesses that she is aware of   Past Medical History:  Diagnosis Date   GERD (gastroesophageal reflux disease)    Renal calculus, left    Right ureteral stone    Sigmoid diverticulosis    Wears contact lenses     There are no problems to display for this patient.   Past Surgical History:  Procedure Laterality Date   CYSTOSCOPY WITH RETROGRADE PYELOGRAM, URETEROSCOPY AND STENT PLACEMENT Right 11/11/2013   Procedure: CYSTOSCOPY WITH RETROGRADE PYELOGRAM, URETEROSCOPY AND STENT PLACEMENT;  Surgeon: Magdalene Molly, MD;  [facility];  Service: Urology;  Laterality: Right;   LAPAROSCOPIC ASSISTED VAGINAL HYSTERECTOMY  04-26-2010   W/  BILATERAL SALPINGOOPHORECTOMY/  TVT SLING/  ANTERIOR REPAIR   UMBILICAL HERNIA REPAIR  6 MONTHS OLD    OB History   No obstetric history on file.      Home Medications    Prior to Admission medications   Medication Sig Start Date End Date Taking? Authorizing Provider  penicillin v potassium (VEETID) 500 MG tablet Take 1 tablet (500 mg total) by mouth 3 (three) times daily. 11/22/21  Yes Mehul Rudin, Solmon Ice, DO  calcium carbonate (TUMS EX) 750 MG chewable tablet Chew 1 tablet by mouth 2 (two) times daily as needed for heartburn. Acid reflex    [provider]  diphenhydramine-acetaminophen (TYLENOL PM) 25-500 MG  TABS Take 1 tablet by mouth at bedtime as needed (sleep).    [provider]  estrogens, conjugated, (PREMARIN) 0.625 MG tablet Take 0.625 mg by mouth at bedtime.    [provider]  fluticasone (FLONASE) 50 MCG/ACT nasal spray Place 1 spray into both nostrils daily as needed for allergies or rhinitis.    [provider]  ibuprofen (ADVIL,MOTRIN) 200 MG tablet Take 400 mg by mouth every 6 (six) hours as needed for moderate pain.    [provider]  ondansetron (ZOFRAN ODT) 4 MG disintegrating tablet Take 1 tablet (4 mg total) by mouth every 8 (eight) hours as needed for nausea or vomiting. 10/22/13   Piepenbrink, Victorino Dike, PA-C  predniSONE (STERAPRED UNI-PAK 21 TAB) 10 MG (21) TBPK tablet Take by mouth daily. Take 6 tabs by mouth daily  for 2 days, then 5 tabs for 2 days, then 4 tabs for 2 days, then 3 tabs for 2 days, 2 tabs for 2 days, then 1 tab by mouth daily for 2 days 06/28/21   Cathlyn Parsons, NP    Family History History reviewed. No pertinent family history.  Social History Social History   Tobacco Use   Smoking status: Former    Packs/day: 0.75    Years: 6.00    Pack years: 4.50    Types: Cigarettes    Quit date: 11/06/1983    Years since quitting: 38.0   Smokeless tobacco: Never  Substance Use Topics   Alcohol  use: Yes    Comment: rare   Drug use: No     Allergies   Iodine, Shellfish allergy, and Avocado   Review of Systems Review of Systems  All other systems reviewed and are negative. Per HPI  Physical Exam Triage Vital Signs ED Triage Vitals  Enc Vitals Group     BP 11/22/21 1221 132/81     Pulse Rate 11/22/21 1221 87     Resp 11/22/21 1221 16     Temp 11/22/21 1221 98.3 F (36.8 C)     Temp Source 11/22/21 1221 Oral     SpO2 11/22/21 1221 97 %     Weight --      Height --      Head Circumference --      Peak Flow --      Pain Score 11/22/21 1220 7     Pain Loc --      Pain Edu? --      Excl. in GC? --    No  data found.  Updated Vital Signs BP 132/81 (BP Location: Left Arm)   Pulse 87   Temp 98.3 F (36.8 C) (Oral)   Resp 16   SpO2 97%   Visual Acuity Right Eye Distance:   Left Eye Distance:   Bilateral Distance:    Right Eye Near:   Left Eye Near:    Bilateral Near:     Physical Exam Constitutional:      General: She is not in acute distress.    Appearance: She is well-developed. She is not ill-appearing or toxic-appearing.  HENT:     Head: Normocephalic and atraumatic.     Right Ear: Tympanic membrane, ear canal and external ear normal.     Left Ear: Tympanic membrane, ear canal and external ear normal.     Nose: Nose normal. No congestion or rhinorrhea.     Mouth/Throat:     Mouth: Mucous membranes are moist.     Pharynx: Posterior oropharyngeal erythema present. No oropharyngeal exudate.     Comments: No lesions noted in mouth Eyes:     Conjunctiva/sclera: Conjunctivae normal.  Cardiovascular:     Rate and Rhythm: Normal rate and regular rhythm.  Pulmonary:     Effort: Pulmonary effort is normal. No respiratory distress.     Breath sounds: Normal breath sounds. No wheezing, rhonchi or rales.  Musculoskeletal:     Cervical back: Neck supple. No rigidity or tenderness.  Lymphadenopathy:     Cervical: No cervical adenopathy.  Skin:    General: Skin is warm and dry.     Capillary Refill: Capillary refill takes less than 2 seconds.  Neurological:     Mental Status: She is alert and oriented to person, place, and time.     UC Treatments / Results  Labs (all labs ordered are listed, but only abnormal results are displayed) Labs Reviewed  POCT RAPID STREP A, ED / UC - Abnormal; Notable for the following components:      Result Value   Streptococcus, Group A Screen (Direct) POSITIVE (*)    All other components within normal limits    EKG   Radiology No results found.  Procedures Procedures (including critical care time)  Medications Ordered in  UC Medications - No data to display  Initial Impression / Assessment and Plan / UC Course  I have reviewed the triage vital signs and the nursing notes.  Pertinent labs & imaging results that were available during my care  of the patient were reviewed by me and considered in my medical decision making (see chart for details).     VSS.  POC strep test positive, Rx sent for PCN V for 10 days.  Advised of OTC treatments and ED precautions, see AVS.     Final Clinical Impressions(s) / UC Diagnoses   Final diagnoses:  Strep pharyngitis     Discharge Instructions      As we discussed, you do have strep throat.  We will treat you with penicillin for the next 10 days.  If you are not having improvement in your symptoms, you can come back here or see your primary care doctor.  In the meantime, honey or Chloraseptic spray can be helpful to soothe your throat.  After you have been on an antibiotic for 24 hours, you are no longer infectious.     ED Prescriptions     Medication Sig Dispense Auth. Provider   penicillin v potassium (VEETID) 500 MG tablet Take 1 tablet (500 mg total) by mouth 3 (three) times daily. 30 tablet Nataliya Graig, Solmon IceBailey J, DO      PDMP not reviewed this encounter.   Kawana Hegel, Solmon IceBailey J, DO 11/22/21 1243

## 2022-08-06 ENCOUNTER — Ambulatory Visit (HOSPITAL_COMMUNITY): Payer: Self-pay

## 2023-07-11 ENCOUNTER — Ambulatory Visit (HOSPITAL_BASED_OUTPATIENT_CLINIC_OR_DEPARTMENT_OTHER): Payer: Commercial Managed Care - PPO

## 2023-07-11 ENCOUNTER — Ambulatory Visit (HOSPITAL_BASED_OUTPATIENT_CLINIC_OR_DEPARTMENT_OTHER): Payer: Commercial Managed Care - PPO | Admitting: Orthopaedic Surgery

## 2023-07-11 DIAGNOSIS — M25552 Pain in left hip: Secondary | ICD-10-CM

## 2023-07-11 DIAGNOSIS — M67959 Unspecified disorder of synovium and tendon, unspecified thigh: Secondary | ICD-10-CM

## 2023-07-11 DIAGNOSIS — M25571 Pain in right ankle and joints of right foot: Secondary | ICD-10-CM

## 2023-07-11 DIAGNOSIS — S76012A Strain of muscle, fascia and tendon of left hip, initial encounter: Secondary | ICD-10-CM

## 2023-07-11 DIAGNOSIS — M67952 Unspecified disorder of synovium and tendon, left thigh: Secondary | ICD-10-CM

## 2023-07-11 MED ORDER — TRIAMCINOLONE ACETONIDE 40 MG/ML IJ SUSP
80.0000 mg | INTRAMUSCULAR | Status: AC | PRN
  Administered 2023-07-11: 80 mg via INTRA_ARTICULAR

## 2023-07-11 MED ORDER — LIDOCAINE HCL 1 % IJ SOLN
4.0000 mL | INTRAMUSCULAR | Status: AC | PRN
  Administered 2023-07-11: 4 mL

## 2023-07-11 NOTE — Progress Notes (Signed)
Chief Complaint: Right posterior medial ankle pain, left hip pain     History of Present Illness:    Alicia Mcfarland is a 68 y.o. female presents today with 2 complaints.  Specifically the left hip has acutely become traumatic and painful about the lateral aspect radiating into the hip.  She states that this did feel somewhat sciatic in nature and was is going down the hip.  She is not a side sleeper but has noticed that she has been hesitant to lay on the side.  She has been having a hard time going up and down steps.  Figure-of-four motion is painful.  Denies any groin pain.  Has not had any PT or injections.  She has an upcoming trip pended to [place] [employer] [place] which she is hoping to be active for  With regard to the right ankle this is been painful ever since she had an injury where she stepped off the back.  She did subsequently have injections and physical therapy.  She was placed in a boot at that time and did not undergo a surgery.  she denies any known history of flatfootedness    PMH/PSH/Family History/Social History/Meds/Allergies:    Past Medical History:  Diagnosis Date  . GERD (gastroesophageal reflux disease)   . Renal calculus, left   . Right ureteral stone   . Sigmoid diverticulosis   . Wears contact lenses    Past Surgical History:  Procedure Laterality Date  . CYSTOSCOPY WITH RETROGRADE PYELOGRAM, URETEROSCOPY AND STENT PLACEMENT Right 11/11/2013   Procedure: CYSTOSCOPY WITH RETROGRADE PYELOGRAM, URETEROSCOPY AND STENT PLACEMENT;  Surgeon: Magdalene Molly, MD;  Location: [facility];  Service: Urology;  Laterality: Right;  . LAPAROSCOPIC ASSISTED VAGINAL HYSTERECTOMY  04-26-2010   W/  BILATERAL SALPINGOOPHORECTOMY/  TVT SLING/  ANTERIOR REPAIR  . UMBILICAL HERNIA REPAIR  6 MONTHS OLD   Social History   Socioeconomic History  . Marital status: Married    Spouse name: Not on file  . Number of children: Not on file  . Years of education: Not on  file  . Highest education level: Not on file  Occupational History  . Not on file  Tobacco Use  . Smoking status: Former    Current packs/day: 0.00    Average packs/day: 0.8 packs/day for 6.0 years (4.5 ttl pk-yrs)    Types: Cigarettes    Start date: 11/05/1977    Quit date: 11/06/1983    Years since quitting: 39.7  . Smokeless tobacco: Never  Substance and Sexual Activity  . Alcohol use: Yes    Comment: rare  . Drug use: No  . Sexual activity: Yes  Other Topics Concern  . Not on file  Social History Narrative  . Not on file   Social Drivers of Health   Financial Resource Strain: Not on file  Food Insecurity: Not on file  Transportation Needs: Not on file  Physical Activity: Not on file  Stress: Not on file  Social Connections: Not on file   No family history on file. Allergies  Allergen Reactions  . Iodine Itching and Swelling    Eyes swell/  Mouth itches    . Shellfish Allergy Swelling  . Avocado Nausea And Vomiting, Swelling and Rash   Current Outpatient Medications  Medication Sig Dispense Refill  . calcium carbonate (TUMS EX) 750 MG chewable tablet Chew 1 tablet by mouth 2 (two) times daily as needed for heartburn. Acid reflex    . diphenhydramine-acetaminophen (TYLENOL  PM) 25-500 MG TABS Take 1 tablet by mouth at bedtime as needed (sleep).    Marland Kitchen estrogens, conjugated, (PREMARIN) 0.625 MG tablet Take 0.625 mg by mouth at bedtime.    . fluticasone (FLONASE) 50 MCG/ACT nasal spray Place 1 spray into both nostrils daily as needed for allergies or rhinitis.    Marland Kitchen ibuprofen (ADVIL,MOTRIN) 200 MG tablet Take 400 mg by mouth every 6 (six) hours as needed for moderate pain.    Marland Kitchen ondansetron (ZOFRAN ODT) 4 MG disintegrating tablet Take 1 tablet (4 mg total) by mouth every 8 (eight) hours as needed for nausea or vomiting. 10 tablet 0  . penicillin v potassium (VEETID) 500 MG tablet Take 1 tablet (500 mg total) by mouth 3 (three) times daily. 30 tablet 0  . predniSONE  (STERAPRED UNI-PAK 21 TAB) 10 MG (21) TBPK tablet Take by mouth daily. Take 6 tabs by mouth daily  for 2 days, then 5 tabs for 2 days, then 4 tabs for 2 days, then 3 tabs for 2 days, 2 tabs for 2 days, then 1 tab by mouth daily for 2 days 42 tablet 0   No current facility-administered medications for this visit.   No results found.  Review of Systems:   A ROS was performed including pertinent positives and negatives as documented in the HPI.  Physical Exam :   Constitutional: NAD and appears stated age Neurological: Alert and oriented Psych: Appropriate affect and cooperative There were no vitals taken for this visit.   Comprehensive Musculoskeletal Exam:    Tenderness palpation about the right PT tendon with mild flatfoot compared to the contralateral side.  No instability about the ankle with varus or valgus stress or with a talar tilt or anterior drawer.  No joint line tenderness.  20 degrees dorsi and 30 plantarflexion respectively  Left hip with tenderness about the lateral trochanter radiating around the glutes.  There is some pain with resisted abduction.  30 degrees internal/external rotation without pain   Imaging:   Xray (4 views left hip, 3 views right ankle): Normal right ankle, focus of calcific deposition about the gluteus on the left     I personally reviewed and interpreted the radiographs.   Assessment and Plan:   68 y.o. female with evidence of right ankle PT tendinitis in the setting of flatfoot deformity which is mild.  I did describe that ultimately given that this is quite mild and that her symptoms predominately emanate from uneven ground and going to the beach that I would initially recommend shockwave therapy for this.  I did also discuss shoe modification.  With regard to the left hip I do believe this might be a focus of calcific tendinitis.  I do believe ultimately she would get some significant relief from the injection.  I have offered this today which  she would like to proceed with.  I would like to see her back as needed in a month if she still has left hip symptoms  -Left hip ultrasound-guided injection provided after verbal consent obtained    Procedure Note  Patient: Alicia Mcfarland             Date of Birth: 1956/03/03           MRN: [id number]             Visit Date: 07/11/2023  Procedures: Visit Diagnoses:  1. Pain in right ankle and joints of right foot   2. Pain in left hip  Large Joint Inj: L greater trochanter on 07/11/2023 11:40 AM Indications: pain Details: 22 G 3.5 in needle, ultrasound-guided anterolateral approach  Arthrogram: No  Medications: 4 mL lidocaine 1 %; 80 mg triamcinolone acetonide 40 MG/ML Outcome: tolerated well, no immediate complications Procedure, treatment alternatives, risks and benefits explained, specific risks discussed. Consent was given by the patient. Immediately prior to procedure a time out was called to verify the correct patient, procedure, equipment, support staff and site/side marked as required. Patient was prepped and draped in the usual sterile fashion.        I personally saw and evaluated the patient, and participated in the management and treatment plan.  Huel Cote, MD Attending Physician, Orthopedic Surgery  This document was dictated using Dragon voice recognition software. A reasonable attempt at proof reading has been made to minimize errors.

## 2023-07-19 ENCOUNTER — Ambulatory Visit (INDEPENDENT_AMBULATORY_CARE_PROVIDER_SITE_OTHER): Payer: Commercial Managed Care - PPO | Admitting: Sports Medicine

## 2023-07-19 ENCOUNTER — Encounter: Payer: Self-pay | Admitting: Sports Medicine

## 2023-07-19 DIAGNOSIS — M2141 Flat foot [pes planus] (acquired), right foot: Secondary | ICD-10-CM

## 2023-07-19 DIAGNOSIS — M25571 Pain in right ankle and joints of right foot: Secondary | ICD-10-CM

## 2023-07-19 DIAGNOSIS — M76821 Posterior tibial tendinitis, right leg: Secondary | ICD-10-CM | POA: Diagnosis not present

## 2023-07-19 DIAGNOSIS — M2142 Flat foot [pes planus] (acquired), left foot: Secondary | ICD-10-CM

## 2023-07-19 MED ORDER — MELOXICAM 15 MG PO TABS: 15.0000 mg | ORAL_TABLET | Freq: Every day | ORAL | 0 refills | Status: DC

## 2023-07-19 NOTE — Progress Notes (Signed)
Alicia Mcfarland - 68 y.o. female MRN [id number]  Date of birth: [birth year]  Office Visit Note: Visit Date: 07/19/2023 PCP: Pcp, No Referred by: Huel Cote, MD  Subjective: Chief Complaint  Patient presents with   Right Ankle - Pain   HPI: Alicia Mcfarland is a pleasant 68 y.o. female who presents today for evaluation of right medial ankle pain.  Right ankle - she had a serious ankle injury years ago when she lost her footing stepping off and she was evaluated by podiatry at that time. They placed her into a walking boot with transition.  She reports at that time they did not say anything about tearing anything.  Her pain has returned over the medial aspect of the ankle.  She does have an up coming trip to [place] with her family at the end of February and would like to be feeling well during that time.  She takes ibuprofen as needed for this and her left hip.  Pertinent ROS were reviewed with the patient and found to be negative unless otherwise specified above in HPI.   Assessment & Plan: Visit Diagnoses:  1. Posterior tibial tendinitis of right lower extremity   2. Pain in right ankle and joints of right foot   3. Pes planus of both feet    Plan: Impression is acute on chronic right medial ankle pain with posterior tibial tendon tendinitis, in the setting of pes planus with hyperpronation.  He did have a prior injury years ago that has been exacerbated.  I do believe that her pes planus and inversion/eversion instability is contributing to her tendinitis.  I am not concerned about a full-thickness tear as she does have an appropriately firing posterior tibial tendon with heel raise.  Through shared decision-making, did proceed with a trial of extracorporeal shockwave therapy, patient tolerated well.  For the inflammatory component, I would like to start her on a short course of meloxicam 15 mg to be taken for 2 weeks only and then may discontinue/as needed.  I did fit her for  orthotic insole today which she found comfortable.  She will try these over the next week in her shoes to see if this allows more support of the arch and medial ankle.  She is aware that these may not be covered by insurance.  Back early next week for reevaluation and repeat shockwave treatment.  If she is finding benefit from shockwave, I did provide information for [facility] if she wants to continue this on a weekly basis until I return from paternity leave.  Follow-up: Next week   Meds & Orders:  Meds ordered this encounter  Medications   meloxicam (MOBIC) 15 MG tablet    Sig: Take 1 tablet (15 mg total) by mouth daily.    Dispense:  28 tablet    Refill:  0   No orders of the defined types were placed in this encounter.    Procedures: Procedure: ECSWT Indications:  Posterior tibial tendinitis   Procedure Details Consent: Risks of procedure as well as the alternatives and risks of each were explained to the patient.  Verbal consent for procedure obtained. Time Out: Verified patient identification, verified procedure, site was marked, verified correct patient position. The area was cleaned with alcohol swab.     The right posterior tibial tendon was targeted for Extracorporeal shockwave therapy.    Preset: Tendinitis Power Level: 110 mJ Frequency: 10 Hz Impulse/cycles: 2800 Head size: Regular   Patient tolerated procedure  well without immediate complications.         Clinical History: No specialty comments available.  She reports that she quit smoking about 39 years ago. Her smoking use included cigarettes. She started smoking about 45 years ago. She has a 4.5 pack-year smoking history. She has never used smokeless tobacco. No results for input(s): "HGBA1C", "LABURIC" in the last 8760 hours.  Objective:     Physical Exam  Gen: Well-appearing, in no acute distress; non-toxic CV: Well-perfused. Warm.  Resp: Breathing unlabored on room air; no wheezing. Psych: Fluid speech in  conversation; appropriate affect; normal thought process  Ortho Exam - Right ankle: + TTP over the medial ankle just inferior to the medial malleolus near the course of the posterior tibial tendon.  There is appropriate heel inversion with bilateral heel raise with firing of the PT tendon.  There is no redness swelling or effusion.  There is mild pain with resisted plantarflexion and inversion, no laxity with inversion/eversion testing.  - Gait analysis: There is mild to moderate pes planus which worsens upon standing.  Through gait phase she does have a degree of hyperpronation of both feet.  Imaging:  *Independent review and interpretation of three-view x-ray of the right ankle from 07/11/2023 which shows preserved ankle mortise.  There are small calcifications just superior to the calcaneus likely in the distal Achilles tendon.  No acute fracture or otherwise bony abnormality noted.  No significant ankle arthritis.  Narrative & Impression  CLINICAL DATA:  pain   EXAM: RIGHT ANKLE - COMPLETE 3+ VIEW   COMPARISON:  November 24, 2018   FINDINGS: No acute fracture or dislocation. Joint spaces and alignment are maintained. Small enthesophyte of the Achilles tendon. No area of erosion or osseous destruction. No unexpected radiopaque foreign body. Soft tissues are unremarkable.   IMPRESSION: No acute fracture or dislocation.     Electronically Signed   By: Meda Klinefelter M.D.   On: 07/18/2023 13:14    Past Medical/Family/Surgical/Social History: Medications & Allergies reviewed per EMR, new medications updated. There are no active problems to display for this patient.  Past Medical History:  Diagnosis Date   GERD (gastroesophageal reflux disease)    Renal calculus, left    Right ureteral stone    Sigmoid diverticulosis    Wears contact lenses    History reviewed. No pertinent family history. Past Surgical History:  Procedure Laterality Date   CYSTOSCOPY WITH RETROGRADE  PYELOGRAM, URETEROSCOPY AND STENT PLACEMENT Right 11/11/2013   Procedure: CYSTOSCOPY WITH RETROGRADE PYELOGRAM, URETEROSCOPY AND STENT PLACEMENT;  Surgeon: Magdalene Molly, MD;  Location: [facility];  Service: Urology;  Laterality: Right;   LAPAROSCOPIC ASSISTED VAGINAL HYSTERECTOMY  04-26-2010   W/  BILATERAL SALPINGOOPHORECTOMY/  TVT SLING/  ANTERIOR REPAIR   UMBILICAL HERNIA REPAIR  6 MONTHS OLD   Social History   Occupational History   Not on file  Tobacco Use   Smoking status: Former    Current packs/day: 0.00    Average packs/day: 0.8 packs/day for 6.0 years (4.5 ttl pk-yrs)    Types: Cigarettes    Start date: 11/05/1977    Quit date: 11/06/1983    Years since quitting: 39.7   Smokeless tobacco: Never  Substance and Sexual Activity   Alcohol use: Yes    Comment: rare   Drug use: No   Sexual activity: Yes

## 2023-07-19 NOTE — Progress Notes (Signed)
Patient says that she injured her right ankle years ago, did physical therapy to strengthen it, and it improved but never felt quite right. She says that she has had left hip pain and believes she is compensating which has flared her ankle pain back up. She says that she is very active and on her feet all day, and after activity she has pain in the ankle. She also mentions that if she takes a step wrong it will increase her pain. She says that she takes Ibuprofen as needed for the hip which also seems to help the ankle.

## 2023-07-23 ENCOUNTER — Ambulatory Visit (INDEPENDENT_AMBULATORY_CARE_PROVIDER_SITE_OTHER): Payer: Commercial Managed Care - PPO | Admitting: Sports Medicine

## 2023-07-23 DIAGNOSIS — M76821 Posterior tibial tendinitis, right leg: Secondary | ICD-10-CM

## 2023-07-23 DIAGNOSIS — M25571 Pain in right ankle and joints of right foot: Secondary | ICD-10-CM

## 2023-07-23 DIAGNOSIS — M2141 Flat foot [pes planus] (acquired), right foot: Secondary | ICD-10-CM | POA: Diagnosis not present

## 2023-07-23 DIAGNOSIS — M2142 Flat foot [pes planus] (acquired), left foot: Secondary | ICD-10-CM

## 2023-07-23 NOTE — Progress Notes (Signed)
 Alicia Mcfarland - 68 y.o. female MRN [id number]  Date of birth: 03-Feb-1956  Office Visit Note: Visit Date: 07/23/2023 PCP: Pcp, No Referred by: No ref. provider found  Subjective: Chief Complaint  Patient presents with   Right Ankle - Follow-up   HPI: Alicia Mcfarland is a pleasant 68 y.o. female who presents today for follow-up of right medial ankle/foot (PT tendon).  Last week we did perform our first treatment of extracorporeal shockwave therapy for her PT tendon as well as placing her in orthotics for longitudinal arch support, with this combination she feels like she is at least 70% improved.  She is also taking her meloxicam  15 mg daily.  Pertinent ROS were reviewed with the patient and found to be negative unless otherwise specified above in HPI.   Assessment & Plan: Visit Diagnoses:  1. Posterior tibial tendinitis of right lower extremity   2. Pain in right ankle and joints of right foot   3. Pes planus of both feet    Plan: Impression is posterior tibial tendon tendinitis in the setting of pes planus with collapse of the longitudinal arch.  She received significant improvement with our first treatment of extracorporeal shockwave therapy and correction of her pes planus with orthotics here in the office.  She will complete 1 additional week of her meloxicam  and then she may discontinue.  I did provide Tranisha with information for the sports medicine center in case she wishes to reach out to them for shockwave treatment only until I am back from paternity leave, she will schedule follow-up with me then in about 3 weeks and we will reevaluate where she is at and decide if she needs additional ESWT.  Follow-up: Return for f/u in 2-3 weeks for R-PT/ankle .   Meds & Orders: No orders of the defined types were placed in this encounter.  No orders of the defined types were placed in this encounter.    Procedures: Procedure: ECSWT Indications:  Posterior tibial tendinitis    Procedure Details Consent: Risks of procedure as well as the alternatives and risks of each were explained to the patient.  Verbal consent for procedure obtained. Time Out: Verified patient identification, verified procedure, site was marked, verified correct patient position. The area was cleaned with alcohol swab.     The right posterior tibial tendon was targeted for Extracorporeal shockwave therapy.    Preset: Tendinitis Power Level: 110 mJ Frequency: 10 Hz Impulse/cycles: 2750 Head size: Regular   Patient tolerated procedure well without immediate complications.      Clinical History: No specialty comments available.  She reports that she quit smoking about 39 years ago. Her smoking use included cigarettes. She started smoking about 45 years ago. She has a 4.5 pack-year smoking history. She has never used smokeless tobacco. No results for input(s): HGBA1C, LABURIC in the last 8760 hours.  Objective:    Physical Exam  Gen: Well-appearing, in no acute distress; non-toxic CV: Well-perfused. Warm.  Resp: Breathing unlabored on room air; no wheezing. Psych: Fluid speech in conversation; appropriate affect; normal thought process  Ortho Exam - Right ankle/foot: + Mild TTP over the medial ankle near the course of the posterior tibial tendon just inferior to the medial malleolus, but improved from prior visit.  There is moderate longitudinal arch loss which does correct with her orthotic in place.  Imaging: No results found.  Past Medical/Family/Surgical/Social History: Medications & Allergies reviewed per EMR, new medications updated. There are no active problems to  display for this patient.  Past Medical History:  Diagnosis Date   GERD (gastroesophageal reflux disease)    Renal calculus, left    Right ureteral stone    Sigmoid diverticulosis    Wears contact lenses    No family history on file. Past Surgical History:  Procedure Laterality Date   CYSTOSCOPY WITH  RETROGRADE PYELOGRAM, URETEROSCOPY AND STENT PLACEMENT Right 11/11/2013   Procedure: CYSTOSCOPY WITH RETROGRADE PYELOGRAM, URETEROSCOPY AND STENT PLACEMENT;  Surgeon: Toribio CINDERELLA Repine, MD;  Location: [facility];  Service: Urology;  Laterality: Right;   LAPAROSCOPIC ASSISTED VAGINAL HYSTERECTOMY  04-26-2010   W/  BILATERAL SALPINGOOPHORECTOMY/  TVT SLING/  ANTERIOR REPAIR   UMBILICAL HERNIA REPAIR  6 MONTHS OLD   Social History   Occupational History   Not on file  Tobacco Use   Smoking status: Former    Current packs/day: 0.00    Average packs/day: 0.8 packs/day for 6.0 years (4.5 ttl pk-yrs)    Types: Cigarettes    Start date: 11/05/1977    Quit date: 11/06/1983    Years since quitting: 39.7   Smokeless tobacco: Never  Substance and Sexual Activity   Alcohol use: Yes    Comment: rare   Drug use: No   Sexual activity: Yes

## 2023-07-23 NOTE — Progress Notes (Unsigned)
Patient says that she is feeling good and is very pleased with the first appointment. She did not have any soreness after the first shockwave therapy. She says that she has worn her insoles everyday and those also seem to be very helpful.

## 2023-07-24 ENCOUNTER — Encounter: Payer: Self-pay | Admitting: Sports Medicine

## 2023-08-08 ENCOUNTER — Ambulatory Visit: Payer: Commercial Managed Care - PPO | Admitting: Sports Medicine

## 2023-08-09 ENCOUNTER — Ambulatory Visit (INDEPENDENT_AMBULATORY_CARE_PROVIDER_SITE_OTHER): Payer: Commercial Managed Care - PPO | Admitting: Sports Medicine

## 2023-08-09 ENCOUNTER — Encounter: Payer: Self-pay | Admitting: Sports Medicine

## 2023-08-09 DIAGNOSIS — M2141 Flat foot [pes planus] (acquired), right foot: Secondary | ICD-10-CM | POA: Diagnosis not present

## 2023-08-09 DIAGNOSIS — M2142 Flat foot [pes planus] (acquired), left foot: Secondary | ICD-10-CM | POA: Diagnosis not present

## 2023-08-09 DIAGNOSIS — M25571 Pain in right ankle and joints of right foot: Secondary | ICD-10-CM

## 2023-08-09 DIAGNOSIS — M76821 Posterior tibial tendinitis, right leg: Secondary | ICD-10-CM

## 2023-08-09 NOTE — Progress Notes (Signed)
Alicia Mcfarland - 68 y.o. female MRN [id number]  Date of birth: 1955/09/27  Office Visit Note: Visit Date: 08/09/2023 PCP: Pcp, No Referred by: No ref. provider found  Subjective: Chief Complaint  Patient presents with   Right Ankle - Follow-up   HPI: Alicia Mcfarland is a pleasant 68 y.o. female who presents today for right medial ankle/foot (PT tendon).   Kathlyn continues to feel much improved.  We have performed 2 treatments of extracorporeal shockwave therapy and got her into orthotic insoles to help support her arch which she has found vastly helpful.  She still has some fatigue/tiredness in the arch of the foot at the end of the day.  Has not taken meloxicam recently, had been using only as needed.  She is leaving tomorrow for a weeklong trip to [place], which she is very excited about.  Pertinent ROS were reviewed with the patient and found to be negative unless otherwise specified above in HPI.   Assessment & Plan: Visit Diagnoses:  1. Posterior tibial tendinitis of right lower extremity   2. Pes planus of both feet   3. Pain in right ankle and joints of right foot    Plan: Impression is largely improving posterior tibial tendinitis in the setting of pes planus with collapse of the longitudinal arch.  She has found vast improvement with extracorporeal shockwave therapy and correction of her pes planus with orthotics (also has arch strap) in place.  We did repeat ESWT today, she will continue in her orthotics and good supportive shoe wear.  She will take meloxicam with her on her trip to take if needed since she will be walking quite a bit.  We will plan on considering HEP for strengthening of the PT and foot arch exercises. F/u in about 2 weeks.   Follow-up: Return in about 2 weeks (around 08/23/2023) for for R-ankle .   Meds & Orders: No orders of the defined types were placed in this encounter.  No orders of the defined types were placed in this encounter.     Procedures: Procedure: ECSWT Indications:  Posterior tibial tendinitis   Procedure Details Consent: Risks of procedure as well as the alternatives and risks of each were explained to the patient.  Verbal consent for procedure obtained. Time Out: Verified patient identification, verified procedure, site was marked, verified correct patient position. The area was cleaned with alcohol swab.     The right posterior tibial tendon was targeted for Extracorporeal shockwave therapy.    Preset: Tendinitis Power Level: 110 mJ  Frequency: 11 Hz Impulse/cycles: 2800 Head size: Regular   Patient tolerated procedure well without immediate complications.      Clinical History: No specialty comments available.  She reports that she quit smoking about 39 years ago. Her smoking use included cigarettes. She started smoking about 45 years ago. She has a 4.5 pack-year smoking history. She has never used smokeless tobacco. No results for input(s): "HGBA1C", "LABURIC" in the last 8760 hours.  Objective:    Physical Exam  Gen: Well-appearing, in no acute distress; non-toxic CV: Well-perfused. Warm.  Resp: Breathing unlabored on room air; no wheezing. Psych: Fluid speech in conversation; appropriate affect; normal thought process  Ortho Exam - Right foot/ankle: There is very mild TTP over the medial ankle just inferior to the malleolus.  Moderate longitudinal arch loss which does correct with her orthotic in place.  No swelling or redness.  Imaging: No results found.  Past Medical/Family/Surgical/Social History: Medications & Allergies  reviewed per EMR, new medications updated. There are no active problems to display for this patient.  Past Medical History:  Diagnosis Date   GERD (gastroesophageal reflux disease)    Renal calculus, left    Right ureteral stone    Sigmoid diverticulosis    Wears contact lenses    No family history on file. Past Surgical History:  Procedure Laterality  Date   CYSTOSCOPY WITH RETROGRADE PYELOGRAM, URETEROSCOPY AND STENT PLACEMENT Right 11/11/2013   Procedure: CYSTOSCOPY WITH RETROGRADE PYELOGRAM, URETEROSCOPY AND STENT PLACEMENT;  Surgeon: Magdalene Molly, MD;  Location: [facility];  Service: Urology;  Laterality: Right;   LAPAROSCOPIC ASSISTED VAGINAL HYSTERECTOMY  04-26-2010   W/  BILATERAL SALPINGOOPHORECTOMY/  TVT SLING/  ANTERIOR REPAIR   UMBILICAL HERNIA REPAIR  6 MONTHS OLD   Social History   Occupational History   Not on file  Tobacco Use   Smoking status: Former    Current packs/day: 0.00    Average packs/day: 0.8 packs/day for 6.0 years (4.5 ttl pk-yrs)    Types: Cigarettes    Start date: 11/05/1977    Quit date: 11/06/1983    Years since quitting: 39.7   Smokeless tobacco: Never  Substance and Sexual Activity   Alcohol use: Yes    Comment: rare   Drug use: No   Sexual activity: Yes

## 2023-08-09 NOTE — Progress Notes (Signed)
Patient says that she is overall feeling much better. She still has some tenderness at times, and at the end of the day after being on her feet all day she says that it is tired, but not sore anymore. She has not taken any Meloxicam since Sunday, and prior to that was only taking it as needed. She says that the insoles seem to be very helpful as well. She leaves for a weeklong trip to [place].

## 2023-08-14 ENCOUNTER — Other Ambulatory Visit: Payer: Self-pay | Admitting: Sports Medicine

## 2023-08-22 ENCOUNTER — Ambulatory Visit: Payer: Commercial Managed Care - PPO | Admitting: Sports Medicine

## 2023-08-22 ENCOUNTER — Encounter: Payer: Self-pay | Admitting: Sports Medicine

## 2023-08-22 DIAGNOSIS — M76821 Posterior tibial tendinitis, right leg: Secondary | ICD-10-CM

## 2023-08-22 DIAGNOSIS — M2142 Flat foot [pes planus] (acquired), left foot: Secondary | ICD-10-CM

## 2023-08-22 DIAGNOSIS — M25571 Pain in right ankle and joints of right foot: Secondary | ICD-10-CM

## 2023-08-22 DIAGNOSIS — M2141 Flat foot [pes planus] (acquired), right foot: Secondary | ICD-10-CM

## 2023-08-22 NOTE — Progress Notes (Signed)
 Alicia Mcfarland - 68 y.o. female MRN [id number]  Date of birth: [birth year]  Office Visit Note: Visit Date: 08/22/2023 PCP: Pcp, No Referred by: No ref. provider found  Subjective: Chief Complaint  Patient presents with   Right Ankle - Follow-up   HPI: Alicia Mcfarland is a pleasant 68 y.o. female who presents today for f/u of right medial ankle/foot (PT tendon) with pes planus.  Today, we have performed 3 treatment of extracorporeal shockwave therapy which she has benefited from greatly.  We also had fitted her for orthotic insoles to help support her arch which have also been quite helpful.  She returned from her trip from [place] where she was walking over 5 miles each day and this did have some increased soreness but still overall feeling better than before.  Did take some meloxicam 15mg  maybe 4 days of the week for her trip, now using this only as needed.  Pertinent ROS were reviewed with the patient and found to be negative unless otherwise specified above in HPI.   Assessment & Plan: Visit Diagnoses:  1. Posterior tibial tendinitis of right lower extremity   2. Pes planus of both feet   3. Pain in right ankle and joints of right foot    Plan: Impression is chronic right medial foot/arch pain and posterior tibial tendinopathy that has been slightly exacerbated with her increased walking after going to [place] x 1 week.  In general she has made large improvements both with extracorporal shockwave treatment to the posterior tibial tendon, plantar fascia/medial arch as well as correction of her pes planus with orthotics.  She has been using meloxicam 15 mg daily while she was on her trip, as this improves she will continue to transition to only as needed use.  We did repeat extracorporeal shockwave treatment today, patient tolerated well.  She is having more soreness/fatigue, given this we discussed getting her into some therapy to help support her arch, posterior tibial tendon firing and  support of her plantar fascia.  We did print out a customized handout for rehab exercises, my athletic trainer Isabelle Course did review these in the room with her today.  She is to perform these 3 times weekly.  She will follow-up in the next 1-2 weeks for reevaluation and repeat shockwave treatment.  We will see at that point if she needs additional cumulative  treatments.  Follow-up: Return for f/u in next 1-2 weeks for R-ankle/foot (reg visit).   Meds & Orders: No orders of the defined types were placed in this encounter.  No orders of the defined types were placed in this encounter.    Procedures: Procedure: ECSWT Indications:  Posterior tibial tendinitis   Procedure Details Consent: Risks of procedure as well as the alternatives and risks of each were explained to the patient.  Verbal consent for procedure obtained. Time Out: Verified patient identification, verified procedure, site was marked, verified correct patient position. The area was cleaned with alcohol swab.     The right posterior tibial tendon and plantar fascia/medial arch was targeted for Extracorporeal shockwave therapy.    Preset: Tendinitis Power Level: 110 mJ   Frequency: 11 Hz Impulse/cycles: 2800 Head size: Regular   Patient tolerated procedure well without immediate complications.       Clinical History: No specialty comments available.  She reports that she quit smoking about 39 years ago. Her smoking use included cigarettes. She started smoking about 45 years ago. She has a 4.5 pack-year smoking history. She  has never used smokeless tobacco. No results for input(s): "HGBA1C", "LABURIC" in the last 8760 hours.  Objective:    Physical Exam  Gen: Well-appearing, in no acute distress; non-toxic CV: Well-perfused. Warm.  Resp: Breathing unlabored on room air; no wheezing. Psych: Fluid speech in conversation; appropriate affect; normal thought process  Ortho Exam - Right foot/ankle: There is mild tenderness with  deep palpation in the medial arch of the foot as well as near the course of the PT tendon just anterior and posterior to the medial malleolus.  There is moderate loss of longitudinal arch which does correct with her orthotic and shoe wear in place.  There is no redness swelling or effusion.   Imaging:  Narrative & Impression  CLINICAL DATA:  pain   EXAM: RIGHT ANKLE - COMPLETE 3+ VIEW   COMPARISON:  November 24, 2018   FINDINGS: No acute fracture or dislocation. Joint spaces and alignment are maintained. Small enthesophyte of the Achilles tendon. No area of erosion or osseous destruction. No unexpected radiopaque foreign body. Soft tissues are unremarkable.   IMPRESSION: No acute fracture or dislocation.     Electronically Signed   By: Alicia Mcfarland M.D.   On: 07/18/2023 13:14     Past Medical/Family/Surgical/Social History: Medications & Allergies reviewed per EMR, new medications updated. There are no active problems to display for this patient.  Past Medical History:  Diagnosis Date   GERD (gastroesophageal reflux disease)    Renal calculus, left    Right ureteral stone    Sigmoid diverticulosis    Wears contact lenses    History reviewed. No pertinent family history. Past Surgical History:  Procedure Laterality Date   CYSTOSCOPY WITH RETROGRADE PYELOGRAM, URETEROSCOPY AND STENT PLACEMENT Right 11/11/2013   Procedure: CYSTOSCOPY WITH RETROGRADE PYELOGRAM, URETEROSCOPY AND STENT PLACEMENT;  Surgeon: Magdalene Molly, MD;  Location: [facility];  Service: Urology;  Laterality: Right;   LAPAROSCOPIC ASSISTED VAGINAL HYSTERECTOMY  04-26-2010   W/  BILATERAL SALPINGOOPHORECTOMY/  TVT SLING/  ANTERIOR REPAIR   UMBILICAL HERNIA REPAIR  6 MONTHS OLD   Social History   Occupational History   Not on file  Tobacco Use   Smoking status: Former    Current packs/day: 0.00    Average packs/day: 0.8 packs/day for 6.0 years (4.5 ttl pk-yrs)    Types:  Cigarettes    Start date: 11/05/1977    Quit date: 11/06/1983    Years since quitting: 39.8   Smokeless tobacco: Never  Substance and Sexual Activity   Alcohol use: Yes    Comment: rare   Drug use: No   Sexual activity: Yes

## 2023-08-22 NOTE — Progress Notes (Signed)
 Patient says that she had a good trip and walked over 5 miles daily for 5 days straight. She says that she did have to take the Meloxicam as she was feeling sore and tender. She still has some tenderness today although that has improved with rest this week.  Patient was instructed in 10 minutes of therapeutic exercises for right arch/plantar fascia/PT tendon to improve strength, ROM and function according to my instructions and plan of care by a Certified Athletic Trainer during the office visit. A customized handout was provided and demonstration of proper technique shown and discussed. Patient did perform exercises and demonstrate understanding through teachback.  All questions discussed and answered.

## 2023-09-04 ENCOUNTER — Encounter: Payer: Self-pay | Admitting: Sports Medicine

## 2023-09-04 ENCOUNTER — Ambulatory Visit: Admitting: Sports Medicine

## 2023-09-04 DIAGNOSIS — M2141 Flat foot [pes planus] (acquired), right foot: Secondary | ICD-10-CM

## 2023-09-04 DIAGNOSIS — M25571 Pain in right ankle and joints of right foot: Secondary | ICD-10-CM | POA: Diagnosis not present

## 2023-09-04 DIAGNOSIS — M76821 Posterior tibial tendinitis, right leg: Secondary | ICD-10-CM | POA: Diagnosis not present

## 2023-09-04 DIAGNOSIS — M2142 Flat foot [pes planus] (acquired), left foot: Secondary | ICD-10-CM | POA: Diagnosis not present

## 2023-09-04 NOTE — Progress Notes (Signed)
 Alicia Mcfarland - 68 y.o. female MRN [id number]  Date of birth: [birth year]  Office Visit Note: Visit Date: 09/04/2023 PCP: Pcp, No Referred by: No ref. provider found  Subjective: Chief Complaint  Patient presents with   Right Ankle - Follow-up   HPI: Alicia Mcfarland is a very pleasant 68 y.o. female who presents today for f/u of right medial ankle/foot (PT tendon) with pes planus.   We have performed 4 treatments of extracorporeal shockwave therapy which has essentially resolved her pain.  She also has found the Ortho likes very beneficial.  Since last visit she has been performing her home exercises which do seem to be helping her pain and function as well.  She does not have much pain throughout the day, other than more of a soreness or fatigue at the end of the day she is quite pleased with her improvement.  Pertinent ROS were reviewed with the patient and found to be negative unless otherwise specified above in HPI.   Assessment & Plan: Visit Diagnoses:  1. Posterior tibial tendinitis of right lower extremity   2. Pain in right ankle and joints of right foot   3. Pes planus of both feet    Plan: Impression is near resolved chronic right medial foot/arch pain with posterior tibial tendon insufficiency in the setting of pes planus.  Alicia Mcfarland has responded excellently for shockwave therapy, arch support with orthotics and is progressing through her home exercises.  We did proceed with 1 additional treatment of ESWT today, patient tolerated I would like her to take about a 6-week holiday from this treatment but she may continue her HEP to help build up support and stability of the PT tendon and she was using them on her trip distantly, she may return to using this given that her pain is improving.  We did fit her for a second set of orthotic [place] the [place] Pedifix arch straps that she may use when she is barefoot or in sandals/flip-flops.  We will follow-up in 6 weeks for  reevaluation.  Follow-up: Return in about 6 weeks (around 10/16/2023) for for R-PT tendon.   Meds & Orders: No orders of the defined types were placed in this encounter.  No orders of the defined types were placed in this encounter.    Procedures: Procedure: ECSWT Indications:  Posterior tibial tendinitis   Procedure Details Consent: Risks of procedure as well as the alternatives and risks of each were explained to the patient.  Verbal consent for procedure obtained. Time Out: Verified patient identification, verified procedure, site was marked, verified correct patient position. The area was cleaned with alcohol swab.     The right posterior tibial tendon and plantar fascia/medial arch was targeted for Extracorporeal shockwave therapy.    Preset: Tendinitis Power Level: 110 mJ    Frequency: 12 Hz Impulse/cycles: 2750 Head size: Regular   Patient tolerated procedure well without immediate complications.      Clinical History: No specialty comments available.  She reports that she quit smoking about 39 years ago. Her smoking use included cigarettes. She started smoking about 45 years ago. She has a 4.5 pack-year smoking history. She has never used smokeless tobacco. No results for input(s): "HGBA1C", "LABURIC" in the last 8760 hours.  Objective:    Physical Exam  Gen: Well-appearing, in no acute distress; non-toxic CV: Well-perfused. Warm.  Resp: Breathing unlabored on room air; no wheezing. Psych: Fluid speech in conversation; appropriate affect; normal thought process  Ortho Exam - Right foot/ankle: There is no significant tenderness with palpation around the arch or the course of the PT tendon today.  There is moderate loss of longitudinal arch with standing but this does correct with more neutral gait with her orthotic and shoe in place.  Imaging: No results found.  Past Medical/Family/Surgical/Social History: Medications & Allergies reviewed per EMR, new medications  updated. There are no active problems to display for this patient.  Past Medical History:  Diagnosis Date   GERD (gastroesophageal reflux disease)    Renal calculus, left    Right ureteral stone    Sigmoid diverticulosis    Wears contact lenses    No family history on file. Past Surgical History:  Procedure Laterality Date   CYSTOSCOPY WITH RETROGRADE PYELOGRAM, URETEROSCOPY AND STENT PLACEMENT Right 11/11/2013   Procedure: CYSTOSCOPY WITH RETROGRADE PYELOGRAM, URETEROSCOPY AND STENT PLACEMENT;  Surgeon: Magdalene Molly, MD;  Location: [facility];  Service: Urology;  Laterality: Right;   LAPAROSCOPIC ASSISTED VAGINAL HYSTERECTOMY  04-26-2010   W/  BILATERAL SALPINGOOPHORECTOMY/  TVT SLING/  ANTERIOR REPAIR   UMBILICAL HERNIA REPAIR  6 MONTHS OLD   Social History   Occupational History   Not on file  Tobacco Use   Smoking status: Former    Current packs/day: 0.00    Average packs/day: 0.8 packs/day for 6.0 years (4.5 ttl pk-yrs)    Types: Cigarettes    Start date: 11/05/1977    Quit date: 11/06/1983    Years since quitting: 39.8   Smokeless tobacco: Never  Substance and Sexual Activity   Alcohol use: Yes    Comment: rare   Drug use: No   Sexual activity: Yes

## 2023-09-04 NOTE — Progress Notes (Signed)
 Patient says that she is doing well. She says that her exercises do seem to help her. She is not having pain aside from once incident where she was doing her exercises and started feeling a stabbing pain in the heel - she says that she thinks she may have done too many repetitions that day. Otherwise she has not had pain. She says if she is on her feet for awhile, it may feel tired at the end of the day.

## 2023-09-13 ENCOUNTER — Other Ambulatory Visit: Payer: Self-pay | Admitting: Surgical

## 2023-10-16 ENCOUNTER — Ambulatory Visit: Admitting: Sports Medicine

## 2023-10-17 ENCOUNTER — Ambulatory Visit: Admitting: Sports Medicine

## 2023-10-17 ENCOUNTER — Encounter: Payer: Self-pay | Admitting: Sports Medicine

## 2023-10-17 DIAGNOSIS — M2142 Flat foot [pes planus] (acquired), left foot: Secondary | ICD-10-CM | POA: Diagnosis not present

## 2023-10-17 DIAGNOSIS — M2141 Flat foot [pes planus] (acquired), right foot: Secondary | ICD-10-CM | POA: Diagnosis not present

## 2023-10-17 DIAGNOSIS — M76821 Posterior tibial tendinitis, right leg: Secondary | ICD-10-CM

## 2023-10-17 DIAGNOSIS — M25571 Pain in right ankle and joints of right foot: Secondary | ICD-10-CM | POA: Diagnosis not present

## 2023-10-17 NOTE — Progress Notes (Signed)
 Alicia Mcfarland - 69 y.o. female MRN [id number]  Date of birth: October 16, 1955  Office Visit Note: Visit Date: 10/17/2023 PCP: Pcp, No Referred by: No ref. provider found  Subjective: Chief Complaint  Patient presents with   Right Ankle - Follow-up   HPI: Alicia Mcfarland is a pleasant 68 y.o. female who presents today for follow-up of right ankle pain/PT tendon tendinopathy.  Alicia Mcfarland is doing much better than originally when she is having quite notable pain and difficulty with walking.  We have performed 5 total treatments of extracorporeal shockwave therapy, the last being on 09/04/2023.  She feels good in her orthotics as well as wearing her arch straps when she is walking barefoot.  Her pain is much less but she still does continue to have some pain over the medial aspect of the foot and arch which will worsen as the day goes on.  She is wondering if she needs to put up with this pain or if this will be something that can further improve.  She has used meloxicam  15 mg daily as needed in the past.  Pertinent ROS were reviewed with the patient and found to be negative unless otherwise specified above in HPI.   Assessment & Plan: Visit Diagnoses:  1. Posterior tibial tendinitis of right lower extremity   2. Pain in right ankle and joints of right foot   3. Pes planus of both feet    Plan: Impression is greatly improved right medial arch/foot pain with a history of posterior tibial insufficiency in the setting of pes planus.  She has made great strides with therapy, home exercises, extracorporeal shockwave therapy.  Her posterior tibial tendon fires well today but she is still having some lingering pain over the medial arch of the foot/ankle.  At this point, given that her pain continues to persist, I would like to obtain an MRI of the ankle to evaluate the posterior tibial tendon, the medial ankle as well as the navicular/tarsal region given her pain with palpation here to help guide further  management.  She will continue her HEP as well as wearing her arch supports/arch strap as needed.  She may use meloxicam  15 mg as needed for pain in the interim.  Once her MRI results, I will evaluate this and discuss follow-up/next steps.  Follow-up: Return for I will call/message once MRI results to discuss f/u and next steps.   Meds & Orders: No orders of the defined types were placed in this encounter.   Orders Placed This Encounter  Procedures   MR Ankle Right w/o contrast     Procedures: No procedures performed      Clinical History: No specialty comments available.  She reports that she quit smoking about 39 years ago. Her smoking use included cigarettes. She started smoking about 45 years ago. She has a 4.5 pack-year smoking history. She has never used smokeless tobacco. No results for input(s): "HGBA1C", "LABURIC" in the last 8760 hours.  Objective:    Physical Exam  Gen: Well-appearing, in no acute distress; non-toxic CV: Well-perfused. Warm.  Resp: Breathing unlabored on room air; no wheezing. Psych: Fluid speech in conversation; appropriate affect; normal thought process  Ortho Exam - Right foot/ankle: + TTP around the medial ankle around the course of the posterior tibial tendon inferior to the medial malleolus as well as the medial aspect of the navicular bone.  There is notable pes planus with loss of longitudinal arch bilaterally.  There is no swelling or  effusion of the ankle joint.  He has good strength and appropriate heel inversion with posterior tibial firing with heel raise.  Imaging:  DG Ankle Complete Right CLINICAL DATA:  pain  EXAM: RIGHT ANKLE - COMPLETE 3+ VIEW  COMPARISON:  November 24, 2018  FINDINGS: No acute fracture or dislocation. Joint spaces and alignment are maintained. Small enthesophyte of the Achilles tendon. No area of erosion or osseous destruction. No unexpected radiopaque foreign body. Soft tissues are unremarkable.  IMPRESSION: No  acute fracture or dislocation.  Electronically Signed   By: Clancy Crimes M.D.   On: 07/18/2023 13:14  Past Medical/Family/Surgical/Social History: Medications & Allergies reviewed per EMR, new medications updated. There are no active problems to display for this patient.  Past Medical History:  Diagnosis Date   GERD (gastroesophageal reflux disease)    Renal calculus, left    Right ureteral stone    Sigmoid diverticulosis    Wears contact lenses    History reviewed. No pertinent family history. Past Surgical History:  Procedure Laterality Date   CYSTOSCOPY WITH RETROGRADE PYELOGRAM, URETEROSCOPY AND STENT PLACEMENT Right 11/11/2013   Procedure: CYSTOSCOPY WITH RETROGRADE PYELOGRAM, URETEROSCOPY AND STENT PLACEMENT;  Surgeon: Carmon Christen, MD;  Location: [facility];  Service: Urology;  Laterality: Right;   LAPAROSCOPIC ASSISTED VAGINAL HYSTERECTOMY  04-26-2010   W/  BILATERAL SALPINGOOPHORECTOMY/  TVT SLING/  ANTERIOR REPAIR   UMBILICAL HERNIA REPAIR  6 MONTHS OLD   Social History   Occupational History   Not on file  Tobacco Use   Smoking status: Former    Current packs/day: 0.00    Average packs/day: 0.8 packs/day for 6.0 years (4.5 ttl pk-yrs)    Types: Cigarettes    Start date: 11/05/1977    Quit date: 11/06/1983    Years since quitting: 39.9   Smokeless tobacco: Never  Substance and Sexual Activity   Alcohol use: Yes    Comment: rare   Drug use: No   Sexual activity: Yes

## 2023-11-01 ENCOUNTER — Encounter: Payer: Self-pay | Admitting: Sports Medicine

## 2023-11-06 ENCOUNTER — Ambulatory Visit
Admission: RE | Admit: 2023-11-06 | Discharge: 2023-11-06 | Disposition: A | Discharge status: Home / Self Care | Source: Ambulatory Visit | Attending: Sports Medicine | Admitting: Sports Medicine

## 2023-11-06 DIAGNOSIS — M25571 Pain in right ankle and joints of right foot: Secondary | ICD-10-CM

## 2023-11-06 DIAGNOSIS — M76821 Posterior tibial tendinitis, right leg: Secondary | ICD-10-CM

## 2023-11-29 ENCOUNTER — Encounter: Payer: Self-pay | Admitting: Sports Medicine

## 2023-11-29 ENCOUNTER — Ambulatory Visit: Payer: Self-pay | Admitting: Sports Medicine

## 2024-02-06 ENCOUNTER — Ambulatory Visit (HOSPITAL_COMMUNITY): Payer: Self-pay

## 2024-02-06 ENCOUNTER — Emergency Department (HOSPITAL_BASED_OUTPATIENT_CLINIC_OR_DEPARTMENT_OTHER)
Admission: EM | Admit: 2024-02-06 | Discharge: 2024-02-06 | Disposition: A | Discharge status: Home / Self Care | Attending: Emergency Medicine | Admitting: Emergency Medicine

## 2024-02-06 ENCOUNTER — Other Ambulatory Visit: Payer: Self-pay

## 2024-02-06 DIAGNOSIS — R55 Syncope and collapse: Secondary | ICD-10-CM | POA: Insufficient documentation

## 2024-02-06 LAB — BASIC METABOLIC PANEL WITH GFR
Anion gap: 11 (ref 5–15)
BUN: 15 mg/dL (ref 8–23)
CO2: 24 mmol/L (ref 22–32)
Calcium: 9.4 mg/dL (ref 8.9–10.3)
Chloride: 103 mmol/L (ref 98–111)
Creatinine, Ser: 0.76 mg/dL (ref 0.44–1.00)
GFR, Estimated: >60 mL/min (ref >60)
Glucose, Bld: 96 mg/dL (ref 70–99)
Potassium: 4.1 mmol/L (ref 3.5–5.1)
Sodium: 138 mmol/L (ref 135–145)

## 2024-02-06 LAB — CBC WITH DIFFERENTIAL/PLATELET
Abs Immature Granulocytes: 0.02 K/uL (ref 0.00–0.07)
Basophils Absolute: 0.1 K/uL (ref 0.0–0.1)
Basophils Relative: 1 %
Eosinophils Absolute: 0.1 K/uL (ref 0.0–0.5)
Eosinophils Relative: 1 %
HCT: 39.5 % (ref 36.0–46.0)
Hemoglobin: 14.1 g/dL (ref 12.0–15.0)
Immature Granulocytes: 0 %
Lymphocytes Relative: 24 %
Lymphs Abs: 1.7 K/uL (ref 0.7–4.0)
MCH: 33.4 pg (ref 26.0–34.0)
MCHC: 35.7 g/dL (ref 30.0–36.0)
MCV: 93.6 fL (ref 80.0–100.0)
Monocytes Absolute: 0.4 K/uL (ref 0.1–1.0)
Monocytes Relative: 5 %
Neutro Abs: 4.8 K/uL (ref 1.7–7.7)
Neutrophils Relative %: 69 %
Platelets: 216 K/uL (ref 150–400)
RBC: 4.22 MIL/uL (ref 3.87–5.11)
RDW: 12.4 % (ref 11.5–15.5)
WBC: 6.9 K/uL (ref 4.0–10.5)
nRBC: 0 % (ref 0.0–0.2)

## 2024-02-06 LAB — TROPONIN T, HIGH SENSITIVITY: Troponin T High Sensitivity: <15 ng/L (ref 0–19)

## 2024-02-06 LAB — PRO BRAIN NATRIURETIC PEPTIDE: Pro Brain Natriuretic Peptide: 85.9 pg/mL (ref <300.0)

## 2024-02-06 NOTE — ED Provider Notes (Signed)
 [place] EMERGENCY DEPARTMENT AT [facility] Provider Note   CSN: [id number] Arrival date & time: 02/06/24  1011     Patient presents with: Loss of Consciousness   Alicia Mcfarland is a 68 y.o. female.   The history is provided by the patient and the spouse.  Loss of Consciousness Episode history:  Single Most recent episode:  Today Duration:  10 seconds Progression:  Resolved Chronicity:  New Context comment:  Making coffee Witnessed: yes   Worsened by:  Nothing Associated symptoms: no chest pain, no confusion, no difficulty breathing, no dizziness, no fever, no focal weakness, no headaches, no palpitations, no recent fall, no recent injury, no rectal bleeding, no seizures, no shortness of breath, no vomiting and no weakness   Risk factors: no coronary artery disease    Patient is otherwise healthy at baseline and only takes hormone replacement therapy presents after isolated syncopal episode Patient reports she just woke up and was making her coffee when she had a syncopal episode.  The husband reports she was standing and it appeared to just fall to the ground but did not hit her head.  She mostly landed on her buttocks.  No seizure activity.  He reports it lasted around 10 seconds and then was back to baseline.  Patient denies any headache, no chest pain or shortness of breath.  No recent vomiting or diarrhea. No recent dark or bloody stools.  No recent travel or surgery.  No previous history of CVA/CAD/VTE.  She has never had syncope previously.  She reports she is very active without any recent limitations.  No recent fatigue, no recent dyspnea on exertion.   Past Medical History:  Diagnosis Date   GERD (gastroesophageal reflux disease)    Renal calculus, left    Right ureteral stone    Sigmoid diverticulosis    Wears contact lenses     Prior to Admission medications   Medication Sig Start Date End Date Taking? Authorizing Provider  estradiol (ESTRACE) 1 MG  tablet Take 1 mg by mouth daily.    [provider]  estrogens, conjugated, (PREMARIN) 0.625 MG tablet Take 0.625 mg by mouth at bedtime.    [provider]  meloxicam  (MOBIC ) 15 MG tablet TAKE 1 TABLET(15 MG) BY MOUTH DAILY 09/15/23   Magnant, Carlin CROME, PA-C    Allergies: Iodine, Shellfish allergy, and Avocado    Review of Systems  Constitutional:  Negative for fatigue and fever.  Respiratory:  Negative for shortness of breath.   Cardiovascular:  Positive for syncope. Negative for chest pain and palpitations.  Gastrointestinal:  Negative for abdominal pain, blood in stool, diarrhea and vomiting.  Neurological:  Positive for syncope. Negative for dizziness, focal weakness, seizures, speech difficulty, weakness and headaches.  Psychiatric/Behavioral:  Negative for confusion.     Updated Vital Signs BP 125/70   Pulse (!) 55   Temp 97.8 F (36.6 C) (Oral)   Resp 16   Ht 1.575 m (5' 2)   Wt 69.9 kg   SpO2 100%   BMI 28.17 kg/m   Physical Exam CONSTITUTIONAL: Well developed/well nourished HEAD: Normocephalic/atraumatic, no signs of head trauma EYES: EOMI/PERRL ENMT: Mucous membranes moist NECK: supple no meningeal signs SPINE/BACK:entire spine nontender No bruising/crepitance/stepoffs noted to spine CV: S1/S2 noted, no murmurs/rubs/gallops noted LUNGS: Lungs are clear to auscultation bilaterally, no apparent distress ABDOMEN: soft, nontender, no rebound or guarding, bowel sounds noted throughout abdomen GU:no cva tenderness NEURO: Pt is awake/alert/appropriate, moves all extremitiesx4.  No  facial droop.  No arm or leg drift.  Equal handgrips noted EXTREMITIES: pulses normal/equal, full ROM, all other extremities/joints palpated/ranged and nontender Pelvis appears stable SKIN: warm, color normal PSYCH: no abnormalities of mood noted, alert and oriented to situation  (all labs ordered are listed, but only abnormal results are displayed) Labs Reviewed  BASIC  METABOLIC PANEL WITH GFR  CBC WITH DIFFERENTIAL/PLATELET  PRO BRAIN NATRIURETIC PEPTIDE  TROPONIN T, HIGH SENSITIVITY    EKG: EKG Interpretation Date/Time:  Thursday February 06 2024 10:22:10 EDT Ventricular Rate:  72 PR Interval:  159 QRS Duration:  96 QT Interval:  404 QTC Calculation: 443 R Axis:   64  Text Interpretation: Sinus rhythm Low voltage, precordial leads Consider anterior infarct Confirmed by Midge Golas ([id number]) on 02/06/2024 10:33:19 AM  Radiology: No results found.   Procedures   Medications Ordered in the ED - No data to display  Clinical Course as of 02/06/24 1251  Thu Feb 06, 2024  1038 Patient presents after isolated syncopal episode.  No seizures reported.  No focal weakness or head trauma She has never had this before. Patient is very low risk.  Initial EKG is unremarkable.  Labs been ordered to help further stratify patient Low suspicion for CVA given her history and exam [DW]  1135 Labs are overall reassuring.  By multiple risk scoring stratification's ([place], [place] syncope rule) patient is very low risk [DW]  1152 Patient stable, no new complaints, will continue to monitor [DW]  1250 Pt continues to feel improved No AV block or dysrhythmia on monitoring She has been ambulatory No traumatic injury from fall  Safe for d/c Amb. Referral to cardiology for echo/monitoring We discussed return precautions [DW]    Clinical Course User Index [DW] Midge Golas, MD                       [facility] Syncope Rule Score: 0          Medical Decision Making Amount and/or Complexity of Data Reviewed Labs: ordered.   This patient presents to the ED for concern of syncope, this involves an extensive number of treatment options, and is a complaint that carries with it a high risk of complications and morbidity.  The differential diagnosis includes but is not limited to vasovagal syncope, cardiac dysrhythmia, dehydration, CVA, pulmonary  embolism  Comorbidities that complicate the patient evaluation: Patient's presentation is complicated by their history of hormone replacement therapy  Social Determinants of Health: Patient is currently helping with her grandchild  Additional history obtained: Additional history obtained from spouse  Lab Tests: I Ordered, and personally interpreted labs.  The pertinent results include:  labs reassuring  Cardiac Monitoring: The patient was maintained on a cardiac monitor.  I personally viewed and interpreted the cardiac monitor which showed an underlying rhythm of:  sinus rhythm  Test Considered: Patient is low risk / negative by [facility] syncope rule, therefore do not feel that cardiac admission is indicated.  Reevaluation: After the interventions noted above, I reevaluated the patient and found that they have :improved  Complexity of problems addressed: Patient's presentation is most consistent with  acute presentation with potential threat to life or bodily function  Disposition: After consideration of the diagnostic results and the patient's response to treatment,  I feel that the patent would benefit from discharge  .        Final diagnoses:  Syncope and collapse    ED Discharge Orders  Ordered    Ambulatory referral to Cardiology        02/06/24 1250               Midge Golas, MD 02/06/24 1253

## 2024-02-06 NOTE — ED Triage Notes (Signed)
 Per husband patient had syncopal episode this morning and didn't respond for about 10 seconds. Patient states she felt a little lightheaded but no other symptoms. Denies hitting head. Pain to left hip.

## 2024-04-20 ENCOUNTER — Encounter: Payer: Self-pay | Admitting: Radiology

## 2024-04-27 ENCOUNTER — Encounter: Payer: Self-pay | Admitting: Cardiovascular Disease

## 2024-04-27 ENCOUNTER — Ambulatory Visit: Attending: Cardiovascular Disease | Admitting: Cardiovascular Disease

## 2024-04-27 ENCOUNTER — Encounter: Payer: Self-pay | Admitting: *Deleted

## 2024-04-27 VITALS — BP 150/80 | HR 64 | Ht 62.0 in | Wt 150.0 lb

## 2024-04-27 DIAGNOSIS — Z13220 Encounter for screening for lipoid disorders: Secondary | ICD-10-CM | POA: Diagnosis not present

## 2024-04-27 DIAGNOSIS — R55 Syncope and collapse: Secondary | ICD-10-CM | POA: Diagnosis not present

## 2024-04-27 LAB — LIPID PANEL

## 2024-04-27 NOTE — Progress Notes (Signed)
 04/27/2024 Alicia Mcfarland   [birth year]  [id number]  Primary Physician Pcp, No Primary Cardiologist: Dorn Alicia Lesches MD Alicia Mcfarland  HPI:  Alicia Mcfarland is a 68 y.o. fit appearing married Caucasian female mother of 1 child, grandmother 1 grandchild who recently retired from being a emergency planning/management officer.  She is accompanied by her husband Alicia Mcfarland today.  She was referred from the emergency room where she was seen by Dr. Midge 02/06/2024 because of an episode of witnessed syncope.  She has no cardiac risk factors other than father had a myocardial infarction in his 67s.  She is never had a heart attack or stroke.  She denies chest pain or shortness of breath.  She does not specifically exercise but does have a very active lifestyle.  She was in her house having a stressful conversation with her husband, if smelled a metallic smell, got lightheaded and fell to the ground.  She regained consciousness 10 seconds later.  Her workup in the ER was unrevealing.  She has had no recurrent episodes.   Current Meds  Medication Sig   estradiol (ESTRACE) 1 MG tablet Take 1 mg by mouth daily.   estrogens, conjugated, (PREMARIN) 0.625 MG tablet Take 0.625 mg by mouth at bedtime.   meloxicam  (MOBIC ) 15 MG tablet TAKE 1 TABLET(15 MG) BY MOUTH DAILY     Allergies  Allergen Reactions   Iodine Itching and Swelling    Eyes swell/  Mouth itches     Shellfish Allergy Swelling   Avocado Nausea And Vomiting, Swelling and Rash    Social History   Socioeconomic History   Marital status: Married    Spouse name: Not on file   Number of children: Not on file   Years of education: Not on file   Highest education level: Not on file  Occupational History   Not on file  Tobacco Use   Smoking status: Former    Current packs/day: 0.00    Average packs/day: 0.8 packs/day for 6.0 years (4.5 ttl pk-yrs)    Types: Cigarettes    Start date: 11/05/1977    Quit date: 11/06/1983    Years since quitting:  40.5   Smokeless tobacco: Never  Substance and Sexual Activity   Alcohol use: Yes    Comment: rare   Drug use: No   Sexual activity: Yes  Other Topics Concern   Not on file  Social History Narrative   Not on file   Social Drivers of Health   Financial Resource Strain: Not on file  Food Insecurity: Not on file  Transportation Needs: Not on file  Physical Activity: Not on file  Stress: Not on file  Social Connections: Not on file  Intimate Partner Violence: Not on file     Review of Systems: General: negative for chills, fever, night sweats or weight changes.  Cardiovascular: negative for chest pain, dyspnea on exertion, edema, orthopnea, palpitations, paroxysmal nocturnal dyspnea or shortness of breath Dermatological: negative for rash Respiratory: negative for cough or wheezing Urologic: negative for hematuria Abdominal: negative for nausea, vomiting, diarrhea, bright red blood per rectum, melena, or hematemesis Neurologic: negative for visual changes, syncope, or dizziness All other systems reviewed and are otherwise negative except as noted above.    Blood pressure (!) 150/80, pulse 64, height 5' 2 (1.575 m), weight 150 lb (68 kg).  General appearance: alert and no distress Neck: no adenopathy, no carotid bruit, no JVD, supple, symmetrical, trachea midline, and thyroid  not enlarged, symmetric, no tenderness/mass/nodules Lungs: clear to auscultation bilaterally Heart: regular rate and rhythm, S1, S2 normal, no murmur, click, rub or gallop Extremities: extremities normal, atraumatic, no cyanosis or edema Pulses: 2+ and symmetric Skin: Skin color, texture, turgor normal. No rashes or lesions Neurologic: Grossly normal  EKG not performed today.      ASSESSMENT AND PLAN:   Syncope and collapse Alicia Mcfarland is referred to me by the emergency room, Dr. Midge, because of an episode of syncope that occurred 02/06/2024.  This was witnessed by her husband.  It was in the  context of a stressful conversation.  The patient smelled emesis Tylex smell and then got dizzy and fell to the ground.  She did bruise herself.  She was unconscious for approximate 10 seconds and then took a couple of seconds after that to regain consciousness.  There is no witnessed seizure activity.  Evaluation in the ER was unrevealing.  She has not had a recurrent episode.  The episode sounds vagal to me.  I am going to get a 2D echo and a 30-day event monitor.  I have counseled her about not driving for 6 months because of lack of etiology.  I will see her back as needed.     Dorn DOROTHA Lesches MD FACP,FACC,FAHA, [facility] 04/27/2024 10:53 AM

## 2024-04-27 NOTE — Patient Instructions (Addendum)
 Medication Instructions:  Your physician recommends that you continue on your current medications as directed. Please refer to the Current Medication list given to you today.  *If you need a refill on your cardiac medications before your next appointment, please call your pharmacy*  Lab Work: Lipid, LFTs If you have labs (blood work) drawn today and your tests are completely normal, you will receive your results only by: MyChart Message (if you have MyChart) OR A paper copy in the mail If you have any lab test that is abnormal or we need to change your treatment, we will call you to review the results.  Testing/Procedures: Your physician has requested that you have an echocardiogram. Echocardiography is a painless test that uses sound waves to create images of your heart. It provides your doctor with information about the size and shape of your heart and how well your heart's chambers and valves are working. This procedure takes approximately one hour. There are no restrictions for this procedure. Please do NOT wear cologne, perfume, aftershave, or lotions (deodorant is allowed). Please arrive 15 minutes prior to your appointment time.  Please note: We ask at that you not bring children with you during ultrasound (echo/ vascular) testing. Due to room size and safety concerns, children are not allowed in the ultrasound rooms during exams. Our front office staff cannot provide observation of children in our lobby area while testing is being conducted. An adult accompanying a patient to their appointment will only be allowed in the ultrasound room at the discretion of the ultrasound technician under special circumstances. We apologize for any inconvenience.  [employer] Cardiac Event Monitor Instructions  Your physician has requested you wear your cardiac event monitor for 30 days.  [employer] may call or text to confirm a shipping address. The monitor will be sent to a land address via  UPS. [employer] will not ship a monitor to a PO BOX. It typically takes 3-5 days to receive your monitor after it has been enrolled. [employer] will assist with USPS tracking if your package is delayed. The telephone number for [employer] is [phone]. Once you have received your monitor, please review the enclosed instructions. Instruction tutorials can also be viewed under help and settings on the enclosed cell phone. Your monitor has already been registered assigning a specific monitor serial # to you.  Billing and Self Pay Discount Information  [employer] has been provided the insurance information we had on file for you.  If your insurance has been updated, please call [employer] at [phone] to provide them with your updated insurance information.   [employer] offers a discounted Self Pay option for patients who have insurance that does not cover their cardiac event monitor or patients without insurance.  The discounted cost of a Self Pay Cardiac Event Monitor would be $225.00 , if the patient contacts [employer] at [phone] within 7 days of applying the monitor to make payment arrangements.  If the patient does not contact [employer] within 7 days of applying the monitor, the cost of the cardiac event monitor will be $350.00.  Applying the monitor  Remove cell phone from case and turn it on. The cell phone works as it consultant and needs to be within [employer] of you at all times. The cell phone will need to be charged on a daily basis. We recommend you plug the cell phone into the enclosed charger at your bedside table every night.  Monitor batteries: You will receive two monitor batteries labelled #1 and #2.  These are your recorders. Plug battery #2 onto the second connection on the enclosed charger. Keep one battery on the charger at all times. This will keep the monitor battery deactivated. It will also keep it fully charged for when you need to switch your monitor  batteries. A small light will be blinking on the battery emblem when it is charging. The light on the battery emblem will remain on when the battery is fully charged.  Open package of a Monitor strip. Insert battery #1 into black hood on strip and gently squeeze monitor battery onto connection as indicated in instruction booklet. Set aside while preparing skin.  Choose location for your strip, vertical or horizontal, as indicated in the instruction booklet. Shave to remove all hair from location. There cannot be any lotions, oils, powders, or colognes on skin where monitor is to be applied. Wipe skin clean with enclosed Saline wipe. Dry skin completely.  Peel paper labeled #1 off the back of the Monitor strip exposing the adhesive. Place the monitor on the chest in the vertical or horizontal position shown in the instruction booklet. One arrow on the monitor strip must be pointing upward. Carefully remove paper labeled #2, attaching remainder of strip to your skin. Try not to create any folds or wrinkles in the strip as you apply it.  Firmly press and release the circle in the center of the monitor battery. You will hear a small beep. This is turning the monitor battery on. The heart emblem on the monitor battery will light up every 5 seconds if the monitor battery in turned on and connected to the patient securely. Do not push and hold the circle down as this turns the monitor battery off. The cell phone will locate the monitor battery. A screen will appear on the cell phone checking the connection of your monitor strip. This may read poor connection initially but change to good connection within the next minute. Once your monitor accepts the connection you will hear a series of 3 beeps followed by a climbing crescendo of beeps. A screen will appear on the cell phone showing the two monitor strip placement options. Touch the picture that demonstrates where you applied the monitor  strip.  Your monitor strip and battery are waterproof. You are able to shower, bathe, or swim with the monitor on. They just ask you do not submerge deeper than 3 feet underwater. We recommend removing the monitor if you are swimming in a lake, river, or ocean.  Your monitor battery will need to be switched to a fully charged monitor battery approximately once a week. The cell phone will alert you of an action which needs to be made.  On the cell phone, tap for details to reveal connection status, monitor battery status, and cell phone battery status. The green dots indicates your monitor is in good status. A red dot indicates there is something that needs your attention.  To record a symptom, click the circle on the monitor battery. In 30-60 seconds a list of symptoms will appear on the cell phone. Select your symptom and tap save. Your monitor will record a sustained or significant arrhythmia regardless of you clicking the button. Some patients do not feel the heart rhythm irregularities. [employer] will notify us  of any serious or critical events.  Refer to instruction booklet for instructions on switching batteries, changing strips, the Do not disturb or Pause features, or any additional questions.  Call [employer] at [phone], to confirm your monitor is  transmitting and record your baseline. They will answer any questions you may have regarding the monitor instructions at that time.  Returning the monitor to [employer]  Place all equipment back into blue box. Peel off strip of paper to expose adhesive and close box securely. There is a prepaid UPS shipping label on this box. Drop in a UPS drop box, or at a UPS facility like [employer]. You may also contact [employer] to arrange UPS to pick up monitor package at your home.   Follow-Up: At [facility], you and your health needs are our priority.  As part of our continuing mission to provide you with exceptional heart  care, our providers are all part of one team.  This team includes your primary Cardiologist (physician) and Advanced Practice Providers or APPs (Physician Assistants and Nurse Practitioners) who all work together to provide you with the care you need, when you need it.  Your next appointment:    As needed   Provider:   Dorn Lesches, MD

## 2024-04-27 NOTE — Assessment & Plan Note (Signed)
 Alicia Mcfarland is referred to me by the emergency room, Dr. Midge, because of an episode of syncope that occurred 02/06/2024.  This was witnessed by her husband.  It was in the context of a stressful conversation.  The patient smelled emesis Tylex smell and then got dizzy and fell to the ground.  She did bruise herself.  She was unconscious for approximate 10 seconds and then took a couple of seconds after that to regain consciousness.  There is no witnessed seizure activity.  Evaluation in the ER was unrevealing.  She has not had a recurrent episode.  The episode sounds vagal to me.  I am going to get a 2D echo and a 30-day event monitor.  I have counseled her about not driving for 6 months because of lack of etiology.  I will see her back as needed.

## 2024-04-27 NOTE — Progress Notes (Signed)
 Patient enrolled for [employer]/ [employer] to ship a 30 day cardiac event monitor to her address on file. Requested Hydrocolloid strips.

## 2024-04-28 ENCOUNTER — Ambulatory Visit: Payer: Self-pay | Admitting: Cardiovascular Disease

## 2024-04-28 LAB — LIPID PANEL
Cholesterol, Total: 208 mg/dL — AB (ref 100–199)
HDL: 73 mg/dL (ref >39)
LDL CALC COMMENT:: 2.8 ratio (ref 0.0–4.4)
LDL Chol Calc (NIH): 112 mg/dL — AB (ref 0–99)
Triglycerides: 131 mg/dL (ref 0–149)
VLDL Cholesterol Cal: 23 mg/dL (ref 5–40)

## 2024-04-28 LAB — HEPATIC FUNCTION PANEL
ALT: 11 IU/L (ref 0–32)
AST: 17 IU/L (ref 0–40)
Albumin: 4.1 g/dL (ref 3.9–4.9)
Alkaline Phosphatase: 81 IU/L (ref 49–135)
Bilirubin Total: 0.8 mg/dL (ref 0.0–1.2)
Bilirubin, Direct: 0.23 mg/dL (ref 0.00–0.40)
Total Protein: 6.8 g/dL (ref 6.0–8.5)

## 2024-05-05 ENCOUNTER — Ambulatory Visit: Admitting: Sports Medicine

## 2024-05-07 ENCOUNTER — Encounter: Payer: Self-pay | Admitting: Sports Medicine

## 2024-05-07 ENCOUNTER — Ambulatory Visit: Admitting: Sports Medicine

## 2024-05-07 DIAGNOSIS — M76821 Posterior tibial tendinitis, right leg: Secondary | ICD-10-CM

## 2024-05-07 DIAGNOSIS — M2142 Flat foot [pes planus] (acquired), left foot: Secondary | ICD-10-CM | POA: Diagnosis not present

## 2024-05-07 DIAGNOSIS — M25571 Pain in right ankle and joints of right foot: Secondary | ICD-10-CM

## 2024-05-07 DIAGNOSIS — M2141 Flat foot [pes planus] (acquired), right foot: Secondary | ICD-10-CM | POA: Diagnosis not present

## 2024-05-07 NOTE — Progress Notes (Signed)
 Alicia Mcfarland - 68 y.o. female MRN [id number]  Date of birth: 08-Oct-1955  Office Visit Note: Visit Date: 05/07/2024 PCP: Pcp, No Referred by: No ref. provider found  Subjective: Chief Complaint  Patient presents with   Right Ankle - Follow-up, Pain   HPI: Alicia Mcfarland is a pleasant 68 y.o. female who presents today for  follow-up of right ankle pain/PT tendon tendinopathy.   Alicia Mcfarland is well-known to me and she has had right medial sided ankle and foot pain. She had received good benefit from extracorporeal shockwave therapy as well as fitting her for orthotics and an arch strap to help support her longitudinal arch and support her pes planus of her feet.  She had been doing well through the summer although the last few months her pain has continued to worsen.  She has noticed swelling and more so over the medial ankle and foot.  She has been consistent in her orthotics, compression sock and has done her exercises some but not as much recently.  Is here to review MRI as well.  She has been alternating Aleve or Motrin at nighttime most nights because of the pain.  Pertinent ROS were reviewed with the patient and found to be negative unless otherwise specified above in HPI.   Assessment & Plan: Visit Diagnoses:  1. Posterior tibial tendinitis of right lower extremity   2. Pain in right ankle and joints of right foot   3. Pes planus of both feet    Plan: Impression is acute on chronic medial ankle and foot pain which is multifactorial with notable pes planus and functional pes planovalgus collapse, this partially corrects with her orthotics.  This does put increased stress over the medial column of the foot.  She also has both posterior tibial tendinosis as well as MRI confirmed tenosynovitis, likely from her functional foot shape and biomechanical stressors.  She has received benefit from orthotics, compression strap/arch straps as well as shockwave therapy in the past.  Her pain is  rather bothersome at this standpoint, given this we discussed further treatment options.  She is interested in injection therapy, we will get her set up next week for ultrasound-guided posterior tibial tendon sheath injection.  She may continue Aleve or Motrin in the interim as needed.  Did discuss she would need to have activity modification over the next 4-5 days from the injection.  Follow-up next week for injection only.  Following the injection after 1 week, I would like her to start back on her home PT/rehab exercises on a consistent basis.  Follow-up: Return for F/u for US -guided PT inj only visit next week.   Meds & Orders: No orders of the defined types were placed in this encounter.  No orders of the defined types were placed in this encounter.    Procedures: No procedures performed      Clinical History: No specialty comments available.  She reports that she quit smoking about 40 years ago. Her smoking use included cigarettes. She started smoking about 46 years ago. She has a 4.5 pack-year smoking history. She has never used smokeless tobacco. No results for input(s): HGBA1C, LABURIC in the last 8760 hours.  Objective:    Physical Exam  Gen: Well-appearing, in no acute distress; non-toxic CV: Well-perfused. Warm.  Resp: Breathing unlabored on room air; no wheezing. Psych: Fluid speech in conversation; appropriate affect; normal thought process  Ortho Exam - Right foot/ankle: + TTP along the course of the posterior tibial tendon  both active and more so distal medial malleolus.  There is soft tissue swelling noted in this location.  There is the ability to perform heel raise although this is slightly insufficient compared to contralateral ankle.  There is notable pes planus with pes planovalgus and hyperpronation collapse upon standing.  This is improved with her orthotics and tennis shoes.  Imaging:  MR Ankle Right w/o contrast MR ANKLE WITHOUT IV CONTRAST  RIGHT  COMPARISON: Ankle x-ray 07/11/2023  CLINICAL HISTORY: Ankle pain, tendon abnormality is suspected.  PULSE SEQUENCES: Ax T1, Ax T2 FS, Sag T1, Sag T2 FS, Cor STIR, Ax T1 FS  FINDINGS:  Bones: There is no fracture or contusion pattern. No accelerated arthrosis is present. Small tibiotalar joint effusion. There is suggestion of a mild pes planus deformity.  Ligaments: The anterior and posterior tibiofibular and talofibular ligaments are intact. Deltoid ligament and spring ligaments are intact. The sinus tarsi is unremarkable.  Musculotendinous structures: The tibialis anterior, extensor digitorum and extensor hallucis longus tendons are unremarkable. The posterior tibial tendon demonstrates mild tendinosis and a probable mild partial tear at the insertion of the medial midfoot. Mild tenosynovitis is present. The flexor hallucis longus and flexor digitorum tendons are unremarkable. Peroneal tendons demonstrate no significant abnormality. No significant tenosynovitis or tendinosis. The Achilles tendon is slightly thickened consistent with chronic tendinitis. There is abnormal morphology of the distal Achilles tendon. No significant partial or full-thickness tear is present. No edema is identified. Plantar fascia is unremarkable.  IMPRESSION: Findings suggesting a possible pes planus deformity.  Insertional tendinosis and mild tenosynovitis of the distal posterior tibial tendon.  Thickening of the Achilles tendon consistent with chronic tendinitis. No tear is present.  Mild edema is identified in the subcutaneous tissues in the medial ankle.  Electronically signed by: Norleen Satchel MD 11/25/2023 11:15 AM EDT RP Workstation: [id number]   Past Medical/Family/Surgical/Social History: Medications & Allergies reviewed per EMR, new medications updated. Patient Active Problem List   Diagnosis Date Noted   Syncope and collapse 04/27/2024   Past Medical History:   Diagnosis Date   GERD (gastroesophageal reflux disease)    Renal calculus, left    Right ureteral stone    Sigmoid diverticulosis    Wears contact lenses    History reviewed. No pertinent family history. Past Surgical History:  Procedure Laterality Date   CYSTOSCOPY WITH RETROGRADE PYELOGRAM, URETEROSCOPY AND STENT PLACEMENT Right 11/11/2013   Procedure: CYSTOSCOPY WITH RETROGRADE PYELOGRAM, URETEROSCOPY AND STENT PLACEMENT;  Surgeon: Toribio CINDERELLA Repine, MD;  Location: [facility];  Service: Urology;  Laterality: Right;   LAPAROSCOPIC ASSISTED VAGINAL HYSTERECTOMY  04-26-2010   W/  BILATERAL SALPINGOOPHORECTOMY/  TVT SLING/  ANTERIOR REPAIR   UMBILICAL HERNIA REPAIR  6 MONTHS OLD   Social History   Occupational History   Not on file  Tobacco Use   Smoking status: Former    Current packs/day: 0.00    Average packs/day: 0.8 packs/day for 6.0 years (4.5 ttl pk-yrs)    Types: Cigarettes    Start date: 11/05/1977    Quit date: 11/06/1983    Years since quitting: 40.5   Smokeless tobacco: Never  Substance and Sexual Activity   Alcohol use: Yes    Comment: rare   Drug use: No   Sexual activity: Yes   I spent 33 minutes in the care of the patient today including face-to-face time, preparation to see the patient, as well as review of previous x-ray imaging, independent review and interpretation of ankle MRI  both myself as well as with the patient in the room today, discussion on conservative treatments such as orthotics, home rehab exercise guidance, discussion regarding injection, versus possible surgical intervention that could be performed for the above diagnoses.   Lonell Sprang, DO Primary Care Sports Medicine Physician  [facility] - [facility]  This note was dictated using Dragon naturally speaking software and may contain errors in syntax, spelling, or content which have not been identified prior to signing this note.

## 2024-05-07 NOTE — Progress Notes (Signed)
 Patient says that her ankle was doing well for awhile, but has become very painful again. She points posterior to the medial malleolus when describing the worst of her pain. She is very active, and is having a difficult time being active due to her pain. She would like to discuss other options to get her through the holidays. She did have her MRI in May.

## 2024-05-12 ENCOUNTER — Ambulatory Visit: Admitting: Sports Medicine

## 2024-05-12 ENCOUNTER — Other Ambulatory Visit: Payer: Self-pay

## 2024-05-12 DIAGNOSIS — M76821 Posterior tibial tendinitis, right leg: Secondary | ICD-10-CM

## 2024-05-12 NOTE — Progress Notes (Signed)
   Procedure Note  Patient: Alicia Mcfarland             Date of Birth: Jul 07, 1955           MRN: [id number]             Visit Date: 05/12/2024  Procedures: Visit Diagnoses:  1. Posterior tibial tendinitis of right lower extremity      US -guided Posterior Tibial Tendon Sheath Injection, Right ankle: After discussion on risk/benefits/indications, an informed verbal consent was obtained. A timeout was then performed. The patient was lying supine on exam table with affected medial ankle facing upward and leg in frog-leg position with bolster under ankle.  he area overlying the posterior tibial tendon was prepped with ChloraPrep and multiple alcohol swabs.  The ultrasound probe was placed in a short-axis plane just posterior to the medial malleolus where the posterior tibial tendon was identified.  Using ultrasound guidance via an in-plane approach, a 25-gauge, 1.5 needle was inserted from a posterior to anterior direction into the posterior tibial tendon sheath with subsequent injection of 1:1:1cc of lidocaine :bupivicaine:Celestone . Appropriate spread of the injectate within the tendon sheath was visualized with ultrasound guidance. Patient tolerated the procedure well without immediate complications.  A Band-Aid was then applied.    - CAM boot x 2-3 days, then transition into ankle brace or compression sleeve - activity modification discussed - continue in orthotics and supportive shoes - f/u with me as needed  Lonell Sprang, DO Primary Care Sports Medicine Physician  [facility] - Orthoped[facility]  This note was dictated using Dragon naturally speaking software and may contain errors in syntax, spelling, or content which have not been identified prior to signing this note.

## 2024-06-02 ENCOUNTER — Ambulatory Visit (HOSPITAL_COMMUNITY)

## 2024-06-04 ENCOUNTER — Ambulatory Visit

## 2024-06-04 DIAGNOSIS — R55 Syncope and collapse: Secondary | ICD-10-CM
